# Patient Record
Sex: Female | Born: 1950 | Race: White | Hispanic: No | Marital: Married | Smoking: Former smoker
Health system: Southern US, Community
[De-identification: ages and names within clinical notes are randomized; demographics above are authoritative.]

## PROBLEM LIST (undated history)

## (undated) DIAGNOSIS — F329 Major depressive disorder, single episode, unspecified: Secondary | ICD-10-CM

## (undated) DIAGNOSIS — M1990 Unspecified osteoarthritis, unspecified site: Secondary | ICD-10-CM

## (undated) DIAGNOSIS — I10 Essential (primary) hypertension: Secondary | ICD-10-CM

## (undated) DIAGNOSIS — F32A Depression, unspecified: Secondary | ICD-10-CM

## (undated) HISTORY — PX: CARPAL TUNNEL RELEASE: SHX101

## (undated) HISTORY — PX: KNEE SURGERY: SHX244

## (undated) HISTORY — PX: GASTRIC BYPASS: SHX52

## (undated) HISTORY — PX: APPENDECTOMY: SHX54

---

## 2009-06-20 HISTORY — PX: OTHER SURGICAL HISTORY: SHX169

## 2009-06-22 ENCOUNTER — Ambulatory Visit: Payer: Self-pay | Admitting: Internal Medicine

## 2009-06-22 ENCOUNTER — Inpatient Hospital Stay (HOSPITAL_COMMUNITY): Admission: EM | Admit: 2009-06-22 | Discharge: 2009-06-26 | Payer: Self-pay | Admitting: Emergency Medicine

## 2009-10-02 ENCOUNTER — Telehealth: Payer: Self-pay | Admitting: Critical Care Medicine

## 2010-01-09 IMAGING — CT CT CHEST W/O CM
2 of 3 series · 15 of 36 positions shown, 18 images · non-contrast
Comparison: Chest x-ray 06/23/2009

CT CHEST

CLINICAL DATA: Shock.  Acute renal failure.  Anemia.  The patient
became hypotensive after tummy tuck procedure.

CT CHEST, ABDOMEN AND PELVIS WITHOUT CONTRAST
TECHNIQUE: Multidetector CT imaging of the chest, abdomen and
pelvis was performed following the standard protocol without IV
contrast.

[Series 2: chest_routine 5.0 b40f st · axial · 0.74mm/px · z∈[-611,-96]mm · 12 of 121 slices shown, 15 images]
[im 9/121  mediastinal]
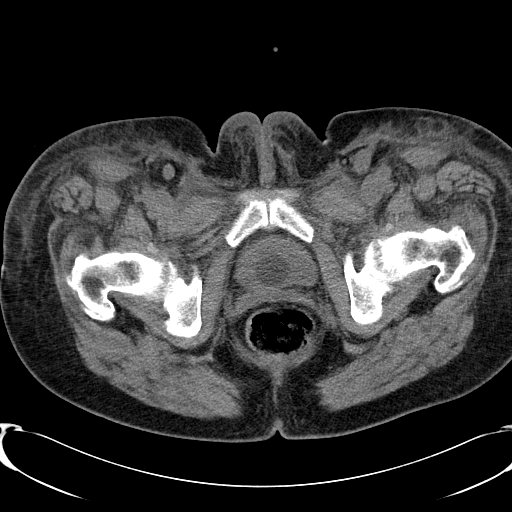
[im 9/121  lung]
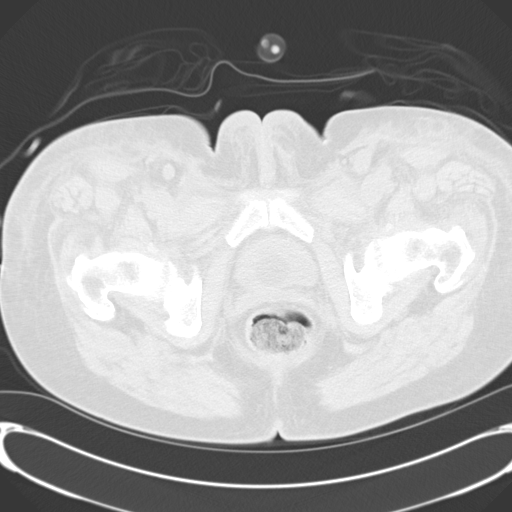
[im 18/121  lung]
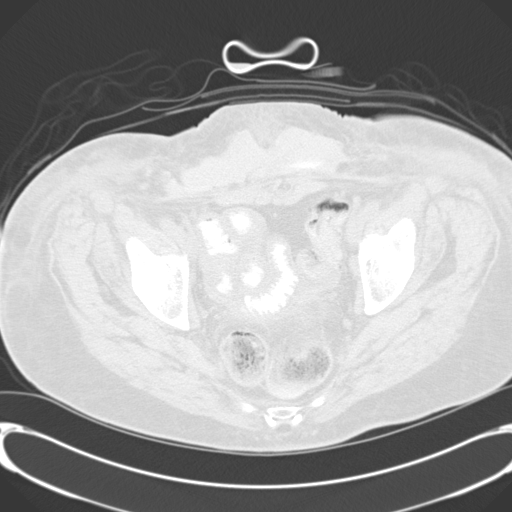
[im 27/121  lung]
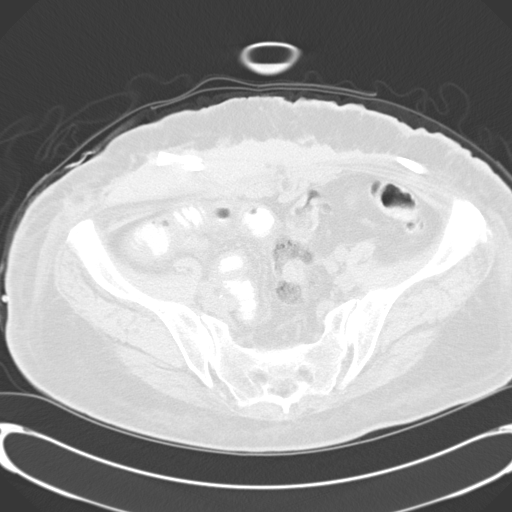
[im 36/121  lung]
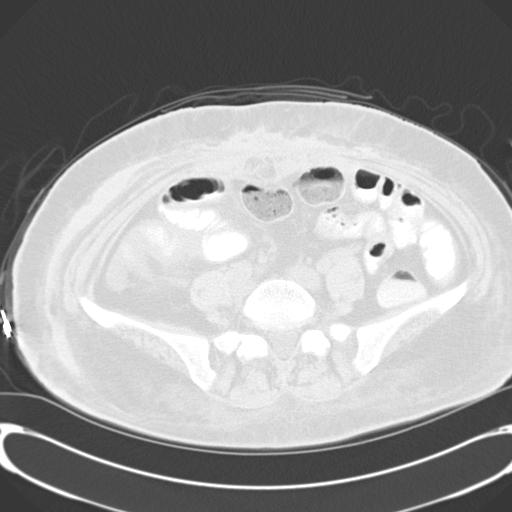
[im 45/121  mediastinal]
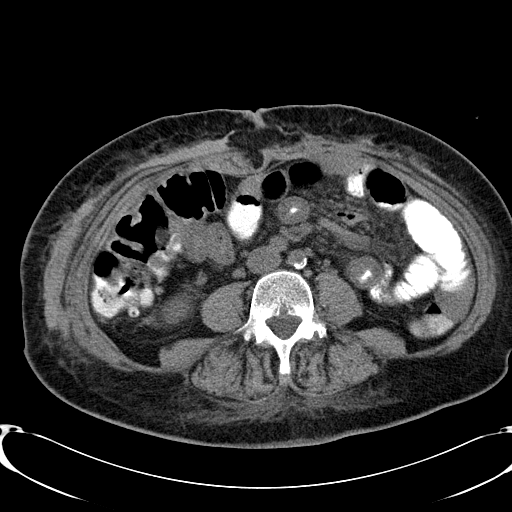
[im 45/121  lung]
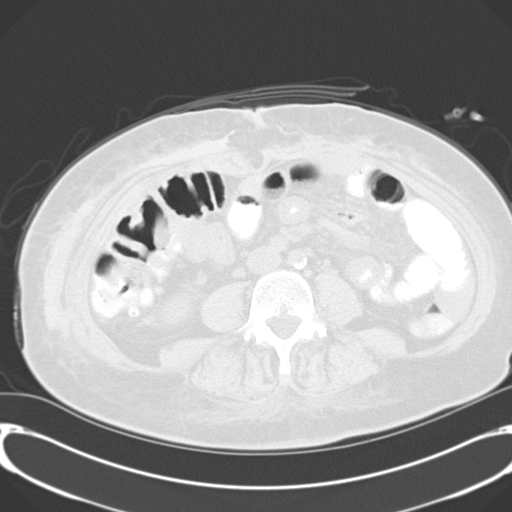
[im 54/121  lung]
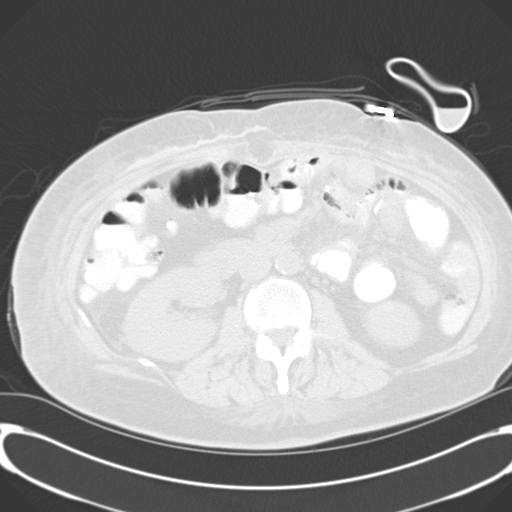
[im 67/121  lung]
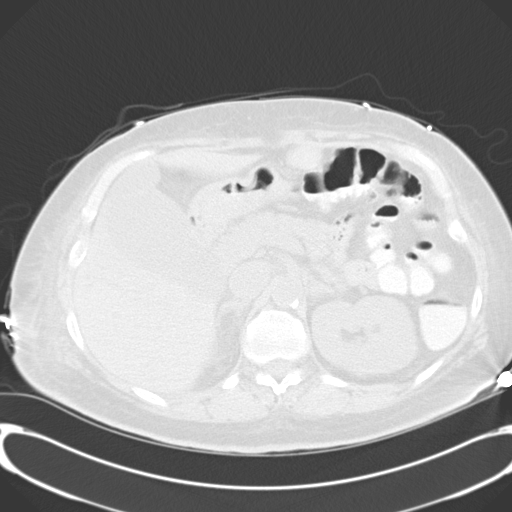
[im 76/121  lung]
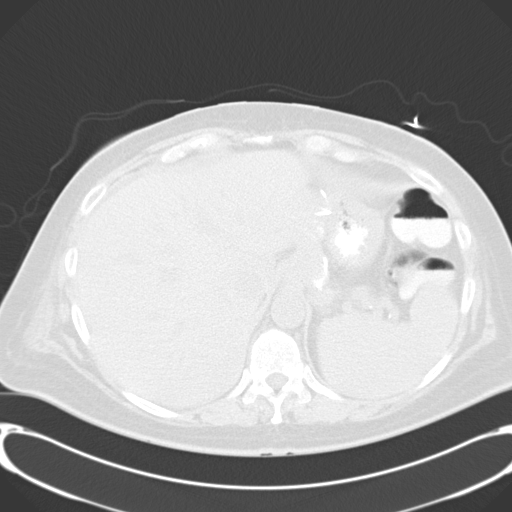
[im 85/121  mediastinal]
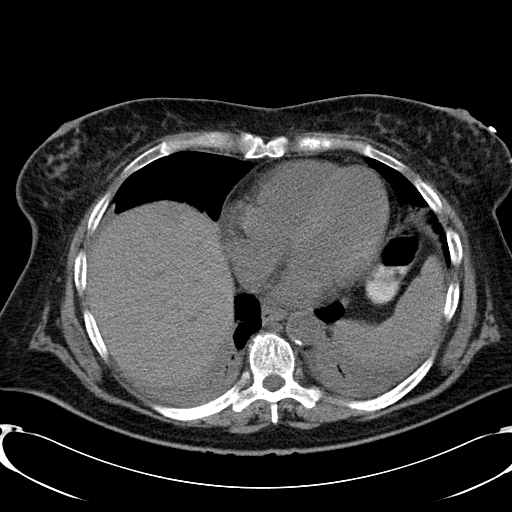
[im 85/121  lung]
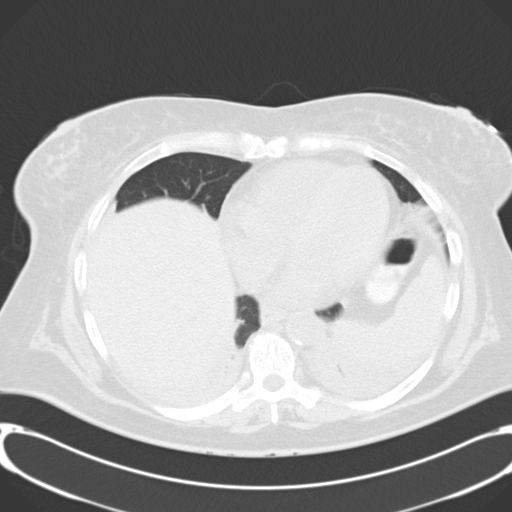
[im 94/121  lung]
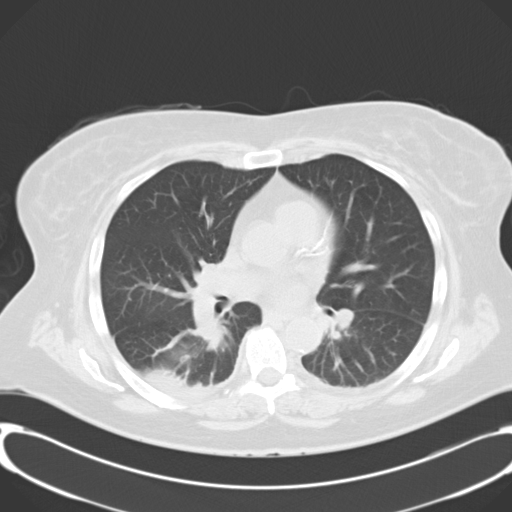
[im 103/121  lung]
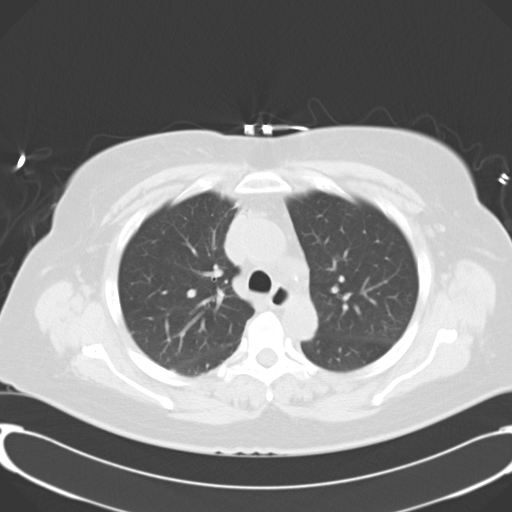
[im 112/121  lung]
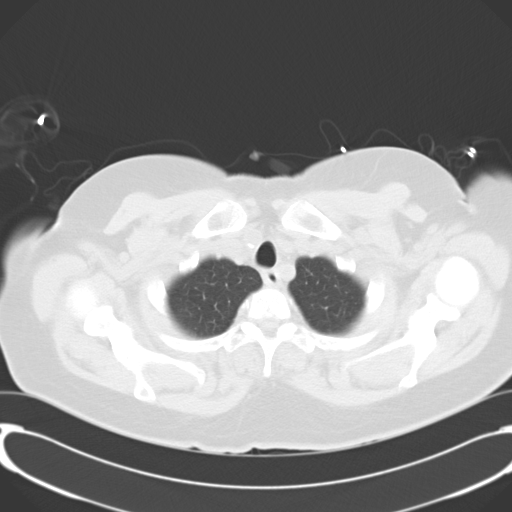

[Series 602: coronal cap · coronal · 1.21mm/px · 3 of 118 slices shown]
[im 24/118  lung]
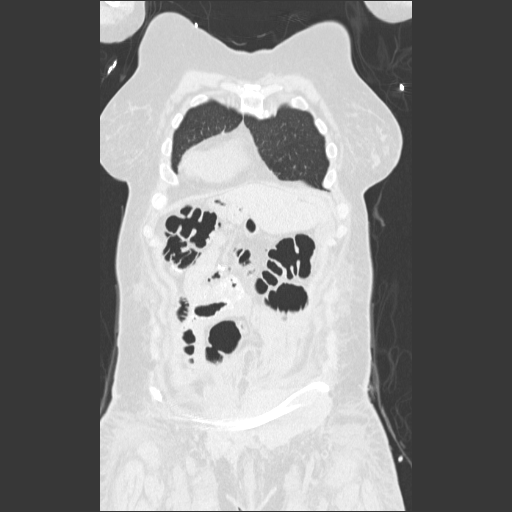
[im 47/118  lung]
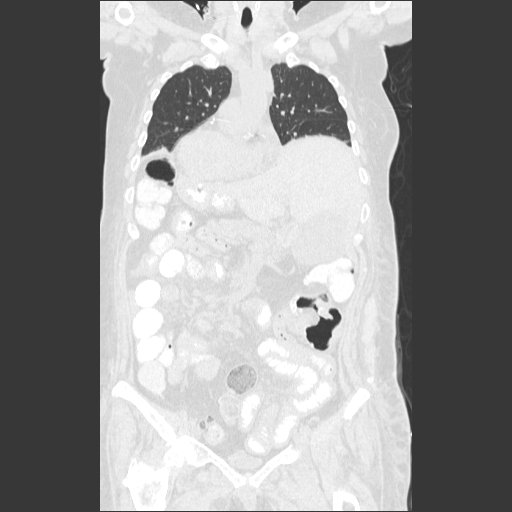
[im 71/118  lung]
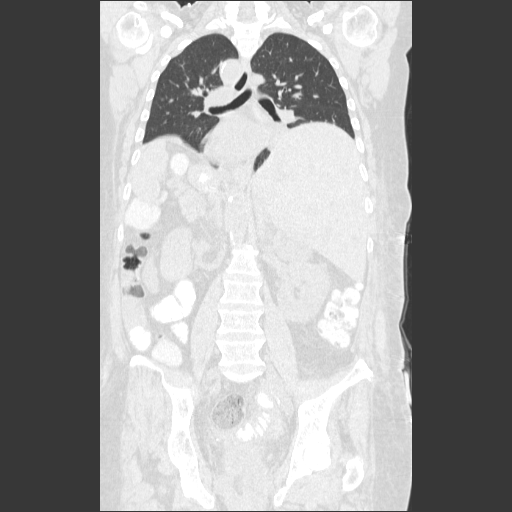

[15 of 36 positions shown; findings below may reference images not displayed]

FINDINGS: There are bilateral small pleural effusions.  Bibasilar
atelectasis / consolidation is identified.  Single anterior
mediastinal lymph node measures 1.4 cm.  No other mediastinal,
hilar, or axillary adenopathy identified.  Lung windows show no
nodules.  There is atherosclerotic calcification of the carotid
arteries.
IMPRESSION: 1.  Bilateral pleural effusions and bibasilar atelectasis or
consolidation.
2.  Small bilateral pleural effusions.

CT ABDOMEN
FINDINGS: Surgical clips are seen in the region of the stomach,
consistent with a history of prior gastric bypass surgery.  The
gallbladder is distended.  There is diffuse body wall and
mesenteric edema, consistent anasarca.  Postoperative changes are
identified within the anterior abdominal wall.  Two IVERSON drains are
identified superficial to the abdominal wall musculature.
Surrounding the IVERSON drains, there is an irregular collection of high-
density fluid, consistent with postoperative hematoma, measuring 16
(left to right) x 4.4 (anterior-posterior) x 7.4 (superior to
inferior) centimeters.  This collection is 1.5 cm in thickness and
extends around the right side of the abdomen superficial to the
transversalis muscle.  There is no evidence for intraperitoneal
collection of blood.

There is mild dilatation of small bowel loops.  Some bowel loops
have slightly thickened wall, possibly related to recent fluid
resuscitation.  No evidence for bowel obstruction.  Contrast has
reached the colon at the time of exam.  There are colonic
diverticula.
IMPRESSION: 1.  Postoperative fluid collection containing blood in the anterior
abdominal wall, surrounding two IVERSON drains.

2.  Gallbladder distention.
3.  Body wall and mesenteric edema.
4.  Diffuse small bowel dilatation, most suggestive of ileus.

CT PELVIS
FINDINGS: There is a Foley catheter in the bladder.  Patient has
had a hysterectomy.  No adnexal mass identified.  No pelvic
adenopathy.  Numerous colonic diverticula are present.  No evidence
for acute diverticulitis however.
IMPRESSION: 1.  Nonspecific small bowel wall thickening.
2.  Diverticulosis.

I discussed the findings with Dr. Pipkin.

## 2010-01-10 IMAGING — CR DG CHEST 1V PORT
1 series · 1 of 1 positions shown · non-contrast
Comparison: Chest CT and plain films of the chest 06/23/2009.

CLINICAL DATA: Shock, acute renal failure.

PORTABLE CHEST - 1 VIEW

[view not recorded]
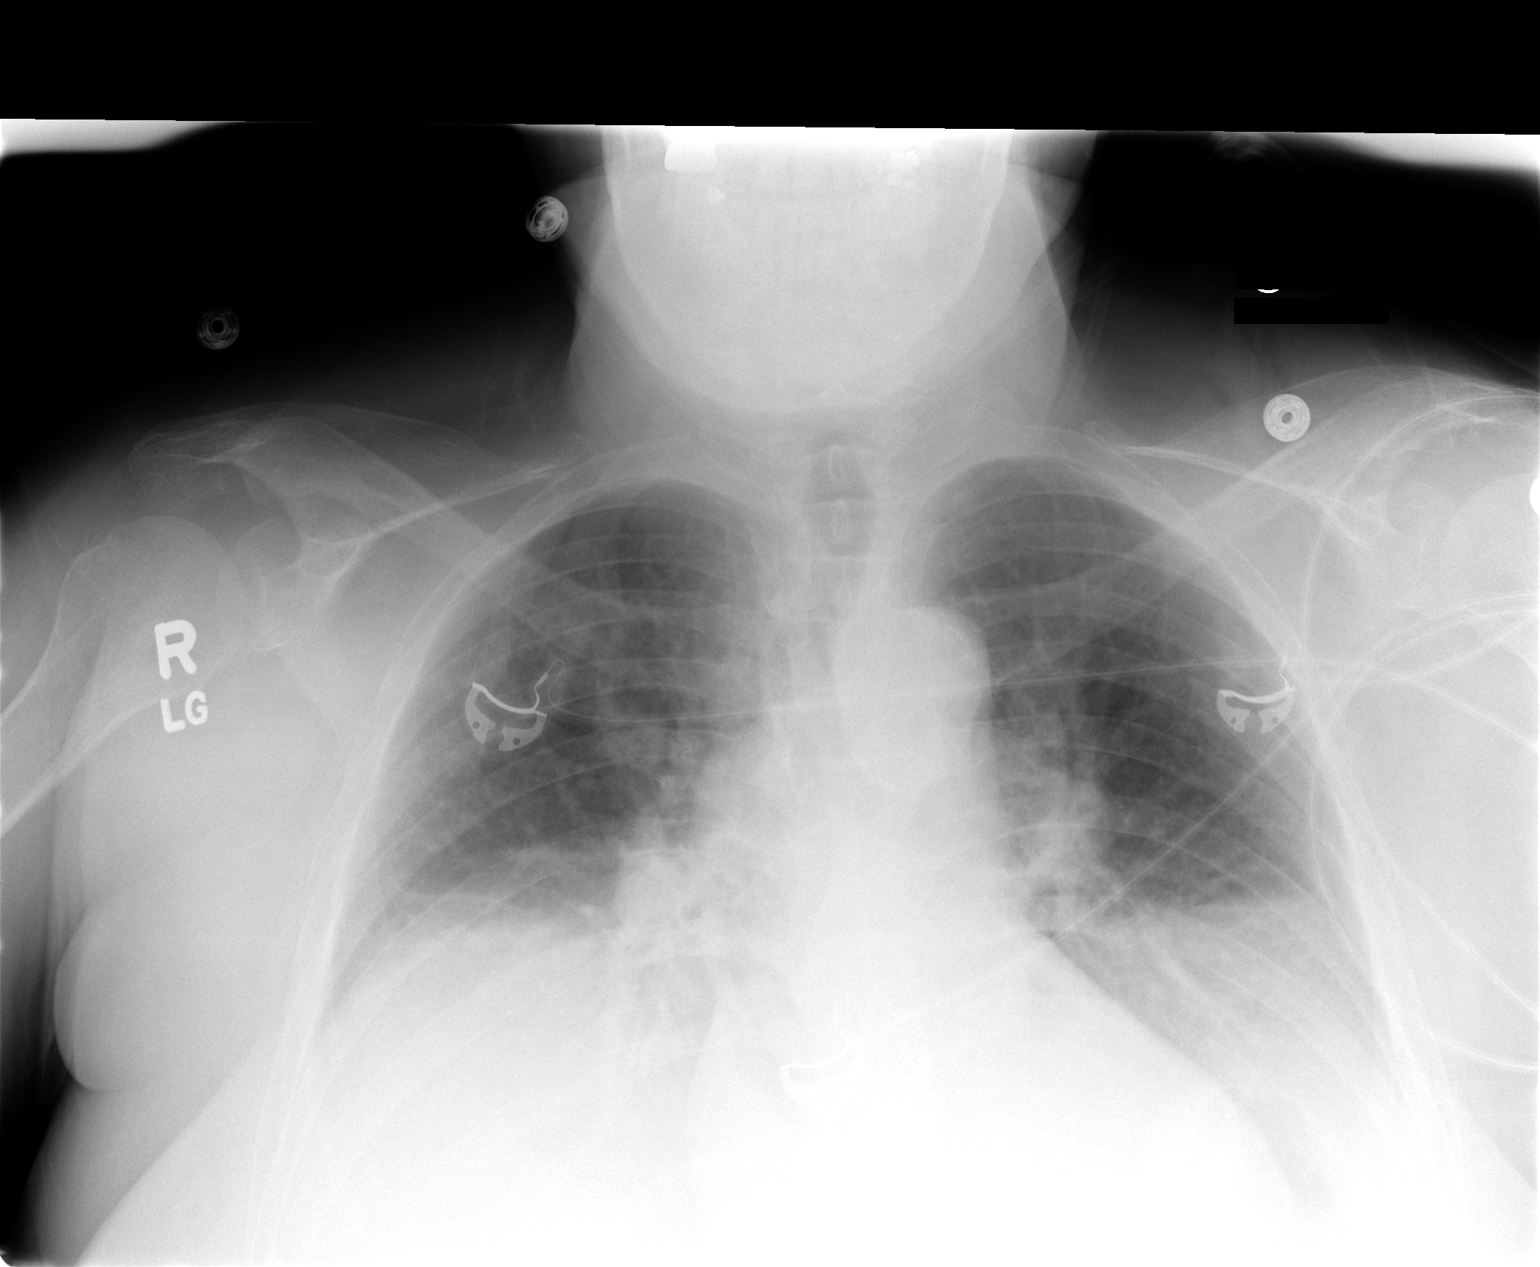

[1 of 1 positions shown; findings below may reference images not displayed]

FINDINGS: Small bilateral pleural effusions and basilar airspace
disease are again seen.  Right basilar atelectasis is increased.
Heart size is upper normal.  There is pulmonary vascular
congestion.
IMPRESSION: 1.  Mild increasing right basilar subsegmental atelectasis.
2.  Pulmonary vascular congestion.

## 2011-01-09 LAB — CBC
HCT: 23.1 % — ABNORMAL LOW (ref 36.0–46.0)
HCT: 23.5 % — ABNORMAL LOW (ref 36.0–46.0)
HCT: 24.5 % — ABNORMAL LOW (ref 36.0–46.0)
HCT: 24.9 % — ABNORMAL LOW (ref 36.0–46.0)
Hemoglobin: 7.6 g/dL — CL (ref 12.0–15.0)
Hemoglobin: 7.7 g/dL — CL (ref 12.0–15.0)
Hemoglobin: 7.8 g/dL — CL (ref 12.0–15.0)
Hemoglobin: 8.1 g/dL — ABNORMAL LOW (ref 12.0–15.0)
Hemoglobin: 8.5 g/dL — ABNORMAL LOW (ref 12.0–15.0)
MCHC: 33.3 g/dL (ref 30.0–36.0)
MCHC: 34 g/dL (ref 30.0–36.0)
MCHC: 34.2 g/dL (ref 30.0–36.0)
MCHC: 34.4 g/dL (ref 30.0–36.0)
MCHC: 34.5 g/dL (ref 30.0–36.0)
MCHC: 34.5 g/dL (ref 30.0–36.0)
MCV: 84.4 fL (ref 78.0–100.0)
MCV: 84.7 fL (ref 78.0–100.0)
MCV: 84.9 fL (ref 78.0–100.0)
MCV: 85.8 fL (ref 78.0–100.0)
Platelets: 182 10*3/uL (ref 150–400)
Platelets: 257 10*3/uL (ref 150–400)
Platelets: 293 10*3/uL (ref 150–400)
RBC: 2.53 MIL/uL — ABNORMAL LOW (ref 3.87–5.11)
RBC: 2.6 MIL/uL — ABNORMAL LOW (ref 3.87–5.11)
RBC: 2.9 MIL/uL — ABNORMAL LOW (ref 3.87–5.11)
RDW: 14.2 % (ref 11.5–15.5)
RDW: 15 % (ref 11.5–15.5)
RDW: 15.2 % (ref 11.5–15.5)
RDW: 15.2 % (ref 11.5–15.5)
RDW: 15.3 % (ref 11.5–15.5)
WBC: 13.1 10*3/uL — ABNORMAL HIGH (ref 4.0–10.5)
WBC: 21.4 10*3/uL — ABNORMAL HIGH (ref 4.0–10.5)
WBC: 28 10*3/uL — ABNORMAL HIGH (ref 4.0–10.5)

## 2011-01-09 LAB — POCT I-STAT, CHEM 8
BUN: 58 mg/dL — ABNORMAL HIGH (ref 6–23)
Calcium, Ion: 0.92 mmol/L — ABNORMAL LOW (ref 1.12–1.32)
Chloride: 103 mEq/L (ref 96–112)
Creatinine, Ser: 2.9 mg/dL — ABNORMAL HIGH (ref 0.4–1.2)
Glucose, Bld: 113 mg/dL — ABNORMAL HIGH (ref 70–99)

## 2011-01-09 LAB — BASIC METABOLIC PANEL
BUN: 55 mg/dL — ABNORMAL HIGH (ref 6–23)
CO2: 24 mEq/L (ref 19–32)
CO2: 32 mEq/L (ref 19–32)
Calcium: 7.5 mg/dL — ABNORMAL LOW (ref 8.4–10.5)
Calcium: 7.8 mg/dL — ABNORMAL LOW (ref 8.4–10.5)
Calcium: 7.8 mg/dL — ABNORMAL LOW (ref 8.4–10.5)
Chloride: 102 mEq/L (ref 96–112)
Chloride: 107 mEq/L (ref 96–112)
GFR calc Af Amer: 49 mL/min — ABNORMAL LOW (ref >60)
GFR calc non Af Amer: 12 mL/min — ABNORMAL LOW (ref >60)
Glucose, Bld: 96 mg/dL (ref 70–99)
Glucose, Bld: 97 mg/dL (ref 70–99)
Potassium: 4.8 mEq/L (ref 3.5–5.1)
Sodium: 139 mEq/L (ref 135–145)
Sodium: 142 mEq/L (ref 135–145)

## 2011-01-09 LAB — HEPATIC FUNCTION PANEL
ALT: 776 U/L — ABNORMAL HIGH (ref 0–35)
AST: 723 U/L — ABNORMAL HIGH (ref 0–37)
Albumin: 2.3 g/dL — ABNORMAL LOW (ref 3.5–5.2)
Alkaline Phosphatase: 64 U/L (ref 39–117)
Indirect Bilirubin: 0.4 mg/dL (ref 0.3–0.9)
Total Protein: 4 g/dL — ABNORMAL LOW (ref 6.0–8.3)

## 2011-01-09 LAB — COMPREHENSIVE METABOLIC PANEL
AST: 1277 U/L — ABNORMAL HIGH (ref 0–37)
AST: 638 U/L — ABNORMAL HIGH (ref 0–37)
Albumin: 2.1 g/dL — ABNORMAL LOW (ref 3.5–5.2)
Albumin: 2.3 g/dL — ABNORMAL LOW (ref 3.5–5.2)
Alkaline Phosphatase: 65 U/L (ref 39–117)
BUN: 11 mg/dL (ref 6–23)
Calcium: 7.3 mg/dL — ABNORMAL LOW (ref 8.4–10.5)
Calcium: 7.4 mg/dL — ABNORMAL LOW (ref 8.4–10.5)
Calcium: 7.6 mg/dL — ABNORMAL LOW (ref 8.4–10.5)
Chloride: 104 mEq/L (ref 96–112)
Creatinine, Ser: 0.76 mg/dL (ref 0.4–1.2)
Creatinine, Ser: 2.36 mg/dL — ABNORMAL HIGH (ref 0.4–1.2)
GFR calc Af Amer: 25 mL/min — ABNORMAL LOW (ref >60)
GFR calc Af Amer: >60 mL/min (ref >60)
GFR calc non Af Amer: 21 mL/min — ABNORMAL LOW (ref >60)
GFR calc non Af Amer: >60 mL/min (ref >60)
Glucose, Bld: 97 mg/dL (ref 70–99)
Total Protein: 4.4 g/dL — ABNORMAL LOW (ref 6.0–8.3)
Total Protein: 4.6 g/dL — ABNORMAL LOW (ref 6.0–8.3)

## 2011-01-09 LAB — DIC (DISSEMINATED INTRAVASCULAR COAGULATION)PANEL
D-Dimer, Quant: 0.5 ug/mL-FEU — ABNORMAL HIGH (ref 0.00–0.48)
INR: 1.9 — ABNORMAL HIGH (ref 0.00–1.49)
Smear Review: NONE SEEN

## 2011-01-09 LAB — CULTURE, BLOOD (ROUTINE X 2): Culture: NO GROWTH

## 2011-01-09 LAB — DIFFERENTIAL
Basophils Absolute: 0 10*3/uL (ref 0.0–0.1)
Basophils Relative: 0 % (ref 0–1)
Eosinophils Relative: 0 % (ref 0–5)
Lymphs Abs: 2.8 10*3/uL (ref 0.7–4.0)
Monocytes Relative: 9 % (ref 3–12)
Neutro Abs: 29.2 10*3/uL — ABNORMAL HIGH (ref 1.7–7.7)

## 2011-01-09 LAB — GLUCOSE, CAPILLARY: Glucose-Capillary: 104 mg/dL — ABNORMAL HIGH (ref 70–99)

## 2011-01-09 LAB — PREPARE FRESH FROZEN PLASMA

## 2011-01-09 LAB — LIPASE, BLOOD: Lipase: 15 U/L (ref 11–59)

## 2011-01-09 LAB — CROSSMATCH: ABO/RH(D): O NEG

## 2011-01-09 LAB — CARDIAC PANEL(CRET KIN+CKTOT+MB+TROPI)
Relative Index: INVALID (ref 0.0–2.5)
Total CK: 53 U/L (ref 7–177)

## 2011-01-09 LAB — POCT CARDIAC MARKERS
Myoglobin, poc: 260 ng/mL (ref 12–200)
Troponin i, poc: <0.05 ng/mL (ref 0.00–0.09)

## 2011-01-09 LAB — URINALYSIS, ROUTINE W REFLEX MICROSCOPIC
Bilirubin Urine: NEGATIVE
Glucose, UA: NEGATIVE mg/dL
Hgb urine dipstick: NEGATIVE
Protein, ur: NEGATIVE mg/dL

## 2011-01-09 LAB — LACTIC ACID, PLASMA: Lactic Acid, Venous: 12.3 mmol/L — ABNORMAL HIGH (ref 0.5–2.2)

## 2011-01-09 LAB — PROTIME-INR
INR: 1.1 (ref 0.00–1.49)
Prothrombin Time: 14.4 seconds (ref 11.6–15.2)

## 2011-01-09 LAB — APTT: aPTT: 28 seconds (ref 24–37)

## 2011-01-09 LAB — CK TOTAL AND CKMB (NOT AT ARMC): CK, MB: 6.6 ng/mL — ABNORMAL HIGH (ref 0.3–4.0)

## 2016-02-20 ENCOUNTER — Encounter (HOSPITAL_COMMUNITY)
Admission: EM | Disposition: A | Discharge status: Home / Self Care | Payer: Self-pay | Source: Home / Self Care | Attending: Internal Medicine

## 2016-02-20 ENCOUNTER — Inpatient Hospital Stay (HOSPITAL_COMMUNITY): Payer: Medicare Other | Admitting: Anesthesiology

## 2016-02-20 ENCOUNTER — Encounter (HOSPITAL_COMMUNITY): Payer: Self-pay

## 2016-02-20 ENCOUNTER — Emergency Department (HOSPITAL_COMMUNITY): Payer: Medicare Other

## 2016-02-20 ENCOUNTER — Inpatient Hospital Stay (HOSPITAL_COMMUNITY)
Admission: EM | Admit: 2016-02-20 | Discharge: 2016-02-22 | DRG: 378 | Disposition: A | Discharge status: Home / Self Care | Payer: Medicare Other | Attending: Internal Medicine | Admitting: Internal Medicine

## 2016-02-20 DIAGNOSIS — K922 Gastrointestinal hemorrhage, unspecified: Secondary | ICD-10-CM | POA: Diagnosis present

## 2016-02-20 DIAGNOSIS — I10 Essential (primary) hypertension: Secondary | ICD-10-CM | POA: Diagnosis present

## 2016-02-20 DIAGNOSIS — Z8249 Family history of ischemic heart disease and other diseases of the circulatory system: Secondary | ICD-10-CM | POA: Diagnosis not present

## 2016-02-20 DIAGNOSIS — M1990 Unspecified osteoarthritis, unspecified site: Secondary | ICD-10-CM | POA: Diagnosis present

## 2016-02-20 DIAGNOSIS — Z833 Family history of diabetes mellitus: Secondary | ICD-10-CM | POA: Diagnosis not present

## 2016-02-20 DIAGNOSIS — K921 Melena: Secondary | ICD-10-CM | POA: Diagnosis not present

## 2016-02-20 DIAGNOSIS — R000 Tachycardia, unspecified: Secondary | ICD-10-CM | POA: Diagnosis present

## 2016-02-20 DIAGNOSIS — Z9884 Bariatric surgery status: Secondary | ICD-10-CM

## 2016-02-20 DIAGNOSIS — F329 Major depressive disorder, single episode, unspecified: Secondary | ICD-10-CM | POA: Diagnosis present

## 2016-02-20 DIAGNOSIS — D62 Acute posthemorrhagic anemia: Secondary | ICD-10-CM

## 2016-02-20 DIAGNOSIS — F32A Depression, unspecified: Secondary | ICD-10-CM

## 2016-02-20 DIAGNOSIS — K254 Chronic or unspecified gastric ulcer with hemorrhage: Secondary | ICD-10-CM | POA: Diagnosis present

## 2016-02-20 DIAGNOSIS — E872 Acidosis, unspecified: Secondary | ICD-10-CM

## 2016-02-20 DIAGNOSIS — R195 Other fecal abnormalities: Secondary | ICD-10-CM | POA: Diagnosis present

## 2016-02-20 DIAGNOSIS — D638 Anemia in other chronic diseases classified elsewhere: Secondary | ICD-10-CM

## 2016-02-20 HISTORY — PX: ESOPHAGOGASTRODUODENOSCOPY: SHX5428

## 2016-02-20 HISTORY — DX: Major depressive disorder, single episode, unspecified: F32.9

## 2016-02-20 HISTORY — DX: Essential (primary) hypertension: I10

## 2016-02-20 HISTORY — DX: Depression, unspecified: F32.A

## 2016-02-20 HISTORY — DX: Unspecified osteoarthritis, unspecified site: M19.90

## 2016-02-20 LAB — LIPASE, BLOOD: Lipase: 18 U/L (ref 11–51)

## 2016-02-20 LAB — COMPREHENSIVE METABOLIC PANEL
ALBUMIN: 3.5 g/dL (ref 3.5–5.0)
ALK PHOS: 58 U/L (ref 38–126)
ALT: 13 U/L — ABNORMAL LOW (ref 14–54)
ANION GAP: 9 (ref 5–15)
AST: 15 U/L (ref 15–41)
BILIRUBIN TOTAL: 0.8 mg/dL (ref 0.3–1.2)
BUN: 40 mg/dL — AB (ref 6–20)
CALCIUM: 8.6 mg/dL — AB (ref 8.9–10.3)
CO2: 20 mmol/L — ABNORMAL LOW (ref 22–32)
Chloride: 109 mmol/L (ref 101–111)
Creatinine, Ser: 0.62 mg/dL (ref 0.44–1.00)
GFR calc Af Amer: >60 mL/min (ref >60)
GLUCOSE: 160 mg/dL — AB (ref 65–99)
Potassium: 3.8 mmol/L (ref 3.5–5.1)
Sodium: 138 mmol/L (ref 135–145)
TOTAL PROTEIN: 5.4 g/dL — AB (ref 6.5–8.1)

## 2016-02-20 LAB — HEMOGLOBIN AND HEMATOCRIT, BLOOD
HEMATOCRIT: 23 % — AB (ref 36.0–46.0)
HEMATOCRIT: 19 % — AB (ref 36.0–46.0)
HEMOGLOBIN: 6.7 g/dL — AB (ref 12.0–15.0)
Hemoglobin: 7.8 g/dL — ABNORMAL LOW (ref 12.0–15.0)

## 2016-02-20 LAB — I-STAT CHEM 8, ED
BUN: 36 mg/dL — ABNORMAL HIGH (ref 6–20)
CALCIUM ION: 1.14 mmol/L (ref 1.13–1.30)
CREATININE: 0.5 mg/dL (ref 0.44–1.00)
Chloride: 106 mmol/L (ref 101–111)
GLUCOSE: 135 mg/dL — AB (ref 65–99)
HCT: 21 % — ABNORMAL LOW (ref 36.0–46.0)
HEMOGLOBIN: 7.1 g/dL — AB (ref 12.0–15.0)
Potassium: 3.7 mmol/L (ref 3.5–5.1)
Sodium: 145 mmol/L (ref 135–145)
TCO2: 18 mmol/L (ref 0–100)

## 2016-02-20 LAB — CBC WITH DIFFERENTIAL/PLATELET
BASOS PCT: 0 %
Basophils Absolute: 0 10*3/uL (ref 0.0–0.1)
Eosinophils Absolute: 0 10*3/uL (ref 0.0–0.7)
Eosinophils Relative: 0 %
HEMATOCRIT: 25.5 % — AB (ref 36.0–46.0)
HEMOGLOBIN: 9 g/dL — AB (ref 12.0–15.0)
LYMPHS PCT: 13 %
Lymphs Abs: 1.9 10*3/uL (ref 0.7–4.0)
MCH: 28 pg (ref 26.0–34.0)
MCHC: 35.3 g/dL (ref 30.0–36.0)
MCV: 79.4 fL (ref 78.0–100.0)
MONO ABS: 0.4 10*3/uL (ref 0.1–1.0)
MONOS PCT: 3 %
NEUTROS ABS: 12.6 10*3/uL — AB (ref 1.7–7.7)
NEUTROS PCT: 84 %
Platelets: 251 10*3/uL (ref 150–400)
RBC: 3.21 MIL/uL — ABNORMAL LOW (ref 3.87–5.11)
RDW: 13.9 % (ref 11.5–15.5)
WBC: 15 10*3/uL — ABNORMAL HIGH (ref 4.0–10.5)

## 2016-02-20 LAB — MRSA PCR SCREENING: MRSA BY PCR: NEGATIVE

## 2016-02-20 LAB — I-STAT CG4 LACTIC ACID, ED: Lactic Acid, Venous: 2.63 mmol/L (ref 0.5–2.0)

## 2016-02-20 LAB — PROTIME-INR
INR: 1.23 (ref 0.00–1.49)
PROTHROMBIN TIME: 15.2 s (ref 11.6–15.2)

## 2016-02-20 LAB — PREPARE RBC (CROSSMATCH)

## 2016-02-20 LAB — POC OCCULT BLOOD, ED: Fecal Occult Bld: POSITIVE — AB

## 2016-02-20 SURGERY — EGD (ESOPHAGOGASTRODUODENOSCOPY)
Anesthesia: Monitor Anesthesia Care

## 2016-02-20 MED ORDER — ONDANSETRON HCL 4 MG/2ML IJ SOLN
INTRAMUSCULAR | Status: AC
  Filled 2016-02-20: qty 2

## 2016-02-20 MED ORDER — SODIUM CHLORIDE 0.9 % IV SOLN
80.0000 mg | Freq: Once | INTRAVENOUS | Status: AC
  Administered 2016-02-20: 80 mg via INTRAVENOUS
  Filled 2016-02-20: qty 80

## 2016-02-20 MED ORDER — LIDOCAINE HCL (CARDIAC) 20 MG/ML IV SOLN
INTRAVENOUS | Status: DC | PRN
  Administered 2016-02-20: 50 mg via INTRATRACHEAL

## 2016-02-20 MED ORDER — SODIUM CHLORIDE 0.9 % IV BOLUS (SEPSIS)
1000.0000 mL | Freq: Once | INTRAVENOUS | Status: AC
Start: 2016-02-20 — End: 2016-02-20
  Administered 2016-02-20: 1000 mL via INTRAVENOUS

## 2016-02-20 MED ORDER — ONDANSETRON HCL 4 MG PO TABS
4.0000 mg | ORAL_TABLET | Freq: Four times a day (QID) | ORAL | Status: DC
  Administered 2016-02-20 – 2016-02-22 (×5): 4 mg via ORAL
  Filled 2016-02-20 (×6): qty 1

## 2016-02-20 MED ORDER — FLUOXETINE HCL 20 MG PO CAPS
40.0000 mg | ORAL_CAPSULE | Freq: Every day | ORAL | Status: DC
  Administered 2016-02-20 – 2016-02-22 (×3): 40 mg via ORAL
  Filled 2016-02-20 (×4): qty 2

## 2016-02-20 MED ORDER — EPINEPHRINE HCL 0.1 MG/ML IJ SOSY
PREFILLED_SYRINGE | INTRAMUSCULAR | Status: AC
  Filled 2016-02-20: qty 10

## 2016-02-20 MED ORDER — FENTANYL CITRATE (PF) 100 MCG/2ML IJ SOLN
INTRAMUSCULAR | Status: AC
  Filled 2016-02-20: qty 2

## 2016-02-20 MED ORDER — PROPOFOL 500 MG/50ML IV EMUL
INTRAVENOUS | Status: DC | PRN
  Administered 2016-02-20: 100 ug/kg/min via INTRAVENOUS

## 2016-02-20 MED ORDER — SODIUM CHLORIDE 0.9 % IV SOLN
8.0000 mg/h | INTRAVENOUS | Status: DC
  Administered 2016-02-20 – 2016-02-21 (×3): 8 mg/h via INTRAVENOUS
  Filled 2016-02-20 (×5): qty 80

## 2016-02-20 MED ORDER — ACETAMINOPHEN 325 MG PO TABS: 650.0000 mg | ORAL_TABLET | Freq: Four times a day (QID) | ORAL | Status: DC | PRN

## 2016-02-20 MED ORDER — LIDOCAINE HCL (CARDIAC) 20 MG/ML IV SOLN
INTRAVENOUS | Status: AC
  Filled 2016-02-20: qty 5

## 2016-02-20 MED ORDER — ONDANSETRON HCL 4 MG/2ML IJ SOLN
4.0000 mg | Freq: Four times a day (QID) | INTRAMUSCULAR | Status: DC
  Administered 2016-02-20 – 2016-02-21 (×3): 4 mg via INTRAVENOUS
  Filled 2016-02-20 (×2): qty 2

## 2016-02-20 MED ORDER — FENTANYL CITRATE (PF) 100 MCG/2ML IJ SOLN
25.0000 ug | Freq: Once | INTRAMUSCULAR | Status: AC
  Administered 2016-02-20: 25 ug via INTRAVENOUS

## 2016-02-20 MED ORDER — PROPOFOL 10 MG/ML IV BOLUS
INTRAVENOUS | Status: DC | PRN
  Administered 2016-02-20: 30 mg via INTRAVENOUS

## 2016-02-20 MED ORDER — ACETAMINOPHEN 650 MG RE SUPP: 650.0000 mg | Freq: Four times a day (QID) | RECTAL | Status: DC | PRN

## 2016-02-20 MED ORDER — PROPOFOL 10 MG/ML IV BOLUS
INTRAVENOUS | Status: AC
  Filled 2016-02-20: qty 40

## 2016-02-20 MED ORDER — SODIUM CHLORIDE 0.9 % IV SOLN
INTRAVENOUS | Status: DC
  Administered 2016-02-20 (×2): via INTRAVENOUS

## 2016-02-20 MED ORDER — CEFTRIAXONE SODIUM 1 G IJ SOLR: 1.0000 g | Freq: Once | INTRAMUSCULAR | Status: DC

## 2016-02-20 MED ORDER — SODIUM CHLORIDE 0.9 % IJ SOLN
PREFILLED_SYRINGE | INTRAMUSCULAR | Status: DC | PRN
  Administered 2016-02-20: 6 mL

## 2016-02-20 MED ORDER — SODIUM CHLORIDE 0.9 % IV SOLN: Freq: Once | INTRAVENOUS | Status: DC

## 2016-02-20 NOTE — ED Provider Notes (Addendum)
CSN: [id number]     Arrival date & time 02/20/16  1000 History   First MD Initiated Contact with Patient 02/20/16 1020     Chief Complaint  Patient presents with  . Shortness of Breath  . Diarrhea  . Nausea     (Consider location/radiation/quality/duration/timing/severity/associated sxs/prior Treatment) HPI  5AM began having dark tarry stools, has had 4 episodes.  Shortness of breath started around 6AM, feeling lightheaded, nausea when standing up with worsening shortness of breath, fatigue, no energy to move.  No vomiting. When standing does have some pain in upper abdomen, mild.  No CP. No hx of bleeding. Gastric bypass 2010.  Last week, had diarrhea, daughter had the same, not black or bloody then.   Past Medical History  Diagnosis Date  . Hypertension   . Depression   . Arthritis    Past Surgical History  Procedure Laterality Date  . Gastric bypass    . Appendectomy    . Carpal tunnel release    . Knee surgery Right   . Tummy tuck  06/20/2009   History reviewed. No pertinent family history. Social History  Substance Use Topics  . Smoking status: Never Smoker   . Smokeless tobacco: Never Used  . Alcohol Use: Yes   OB History    No data available     Review of Systems  Constitutional: Negative for fever.  HENT: Negative for sore throat.   Eyes: Negative for visual disturbance.  Respiratory: Positive for shortness of breath. Negative for cough.   Cardiovascular: Negative for chest pain.  Gastrointestinal: Positive for nausea, abdominal pain (mild epigastric), diarrhea and blood in stool. Negative for vomiting.  Genitourinary: Negative for difficulty urinating.  Musculoskeletal: Negative for back pain and neck pain.  Skin: Negative for rash.  Neurological: Positive for light-headedness. Negative for syncope and headaches.      Allergies  Review of patient's allergies indicates no known allergies.  Home Medications   Prior to Admission medications    Medication Sig Start Date End Date Taking? Authorizing Provider  acetaminophen (TYLENOL) 500 MG tablet Take 1,000 mg by mouth every 6 (six) hours as needed for mild pain, moderate pain, fever or headache.   Yes Historical Provider, MD  calcium carbonate (OS-CAL) 600 MG tablet Take 600 mg by mouth daily.   Yes Historical Provider, MD  Cholecalciferol (VITAMIN D) 2000 units tablet Take 2,000 Units by mouth daily.   Yes Historical Provider, MD  FLUoxetine (PROZAC) 40 MG capsule Take 40 mg by mouth daily.   Yes Historical Provider, MD  lisinopril-hydrochlorothiazide (PRINZIDE,ZESTORETIC) 20-12.5 MG tablet Take 1 tablet by mouth daily. 02/14/16  Yes Historical Provider, MD  meloxicam (MOBIC) 15 MG tablet Take 15 mg by mouth daily.   Yes Historical Provider, MD   BP 116/61 mmHg  Pulse 84  Temp(Src) 98.5 F (36.9 C) (Oral)  Resp 11  Ht 5\' 4"  (1.626 m)  Wt 158 lb 11.7 oz (72 kg)  BMI 27.23 kg/m2  SpO2 99% Physical Exam  Constitutional: She is oriented to person, place, and time. She appears well-developed and well-nourished. No distress.  HENT:  Head: Normocephalic and atraumatic.  Eyes: Conjunctivae and EOM are normal.  Neck: Normal range of motion.  Cardiovascular: Normal rate, regular rhythm, normal heart sounds and intact distal pulses.  Exam reveals no gallop and no friction rub.   No murmur heard. Pulmonary/Chest: Effort normal and breath sounds normal. No respiratory distress. She has no wheezes. She has no rales.  Abdominal: Soft.  She exhibits no distension. There is tenderness (mild epigastric). There is no guarding.  Genitourinary: Rectal exam shows external hemorrhoid. Guaiac positive stool.  Melena, maroon tinge  Musculoskeletal: She exhibits no edema or tenderness.  Neurological: She is alert and oriented to person, place, and time.  Skin: Skin is warm and dry. No rash noted. She is not diaphoretic. No erythema. There is pallor.  Nursing note and vitals reviewed.   ED Course   Procedures (including critical care time) Labs Review Labs Reviewed  CBC WITH DIFFERENTIAL/PLATELET - Abnormal; Notable for the following:    WBC 15.0 (*)    RBC 3.21 (*)    Hemoglobin 9.0 (*)    HCT 25.5 (*)    Neutro Abs 12.6 (*)    All other components within normal limits  COMPREHENSIVE METABOLIC PANEL - Abnormal; Notable for the following:    CO2 20 (*)    Glucose, Bld 160 (*)    BUN 40 (*)    Calcium 8.6 (*)    Total Protein 5.4 (*)    ALT 13 (*)    All other components within normal limits  HEMOGLOBIN AND HEMATOCRIT, BLOOD - Abnormal; Notable for the following:    Hemoglobin 7.8 (*)    HCT 23.0 (*)    All other components within normal limits  HEMOGLOBIN AND HEMATOCRIT, BLOOD - Abnormal; Notable for the following:    Hemoglobin 6.7 (*)    HCT 19.0 (*)    All other components within normal limits  POC OCCULT BLOOD, ED - Abnormal; Notable for the following:    Fecal Occult Bld POSITIVE (*)    All other components within normal limits  I-STAT CHEM 8, ED - Abnormal; Notable for the following:    BUN 36 (*)    Glucose, Bld 135 (*)    Hemoglobin 7.1 (*)    HCT 21.0 (*)    All other components within normal limits  I-STAT CG4 LACTIC ACID, ED - Abnormal; Notable for the following:    Lactic Acid, Venous 2.63 (*)    All other components within normal limits  MRSA PCR SCREENING  PROTIME-INR  LIPASE, BLOOD  HEMOGLOBIN AND HEMATOCRIT, BLOOD  BASIC METABOLIC PANEL  HEMOGLOBIN AND HEMATOCRIT, BLOOD  TYPE AND SCREEN  PREPARE RBC (CROSSMATCH)    Imaging Review Dg Chest Portable 1 View  02/20/2016  CLINICAL DATA:  New onset of shortness of breath, dark stools today EXAM: PORTABLE CHEST 1 VIEW COMPARISON:  Chest x-ray of 06/24/2009 FINDINGS: No active infiltrate or effusion is seen. Mediastinal and hilar contours are unremarkable. The heart is within normal limits in size. No bony abnormality is seen. IMPRESSION: No active disease. Electronically Signed   By: Dwyane Dee M.D.    On: 02/20/2016 11:21   I have personally reviewed and evaluated these images and lab results as part of my medical decision-making.   EKG Interpretation   Date/Time:  Thursday Feb 20 2016 10:14:23 EDT Ventricular Rate:  126 PR Interval:  108 QRS Duration: 136 QT Interval:  332 QTC Calculation: 481 R Axis:   -124 Text Interpretation:  Sinus tachycardia Consider right atrial enlargement  Right bundle branch block No significant change since last tracing  Confirmed by Sanford Bismarck MD, Salena Ortlieb ([id number]) on 02/20/2016 10:50:33 AM      MDM   Final diagnoses:  Melena  Anemia due to blood loss, acute  Lactic acidosis   65yo female with history of gastric bypass presents with concern for dark tarry and bloody stools  beginning at [facility] today with associated lightheadedness and shortness of breath.  Most recent hgb from here is from 2010 when pt was hospitalized after a surgery and had significant illness and anemia, however reports she donates blood every 2 weeks (minimum hgb 12.5) and that 2 weeks ago she was found to have hgb of 14.  Suspect acute GI bleed given significant drop since symptoms started at 5AM and normal hemoglobin, as well as elevated BUN and patient symptomatic with maroon tinged melena on exam.  2 IV placed and pt placed on protonix gtt.  Have low threshold to transfuse, however given hgb 9 (suspect 7.1 istat was lab abnormality) and blood pressures stable without additional episodes in ED, will not transfuse at this time and continue to monitor.  Consulted GI who will perform endoscopy on patient.  Given 1L of NS, protonix. Doubt other etiology of lactic acidosis or dyspnea by history. Concern for possible anastamotic ulcer given hx of gastric bypass. No other hx of cirrhosis. Pt to be admitted to stepdown for continued care.    Alvira Monday, MD 02/20/16 0865  Alvira Monday, MD 02/20/16 [phone]

## 2016-02-20 NOTE — Anesthesia Postprocedure Evaluation (Signed)
Anesthesia Post Note  Patient: Lindsay Cruz  Procedure(s) Performed: Procedure(s) (LRB): ESOPHAGOGASTRODUODENOSCOPY (EGD) (N/A)  Patient location during evaluation: PACU Anesthesia Type: MAC Level of consciousness: awake and alert and oriented Pain management: pain level controlled Vital Signs Assessment: post-procedure vital signs reviewed and stable Respiratory status: spontaneous breathing, nonlabored ventilation and respiratory function stable Cardiovascular status: blood pressure returned to baseline and stable Anesthetic complications: no    Last Vitals:  Filed Vitals:   02/20/16 1500 02/20/16 1510  BP: 147/66 160/71  Pulse: 87 89  Temp:    Resp: 11 8    Last Pain:  Filed Vitals:   02/20/16 1513  PainSc: 3                  Tayson Schnelle A.

## 2016-02-20 NOTE — Progress Notes (Signed)
GI on call MD paged rt pt low hgb.

## 2016-02-20 NOTE — H&P (Signed)
History and Physical    Lindsay Cruz ZOX:[id number] DOB: 09/19/1951 DOA: 02/20/2016   PCP: Maximiano Coss, MD     Patient coming from: home  Chief Complaint: black stools   HPI: Lindsay Cruz is a 65 y.o. female with medical history significant of gastric bypass, arthritis, HTN, depression who is on Mobic presents with 4 episodes of black stools starting 5 AM. No vomiting. Felt nausea, lightheaded, and dyspneic when standing. She has occasional heart burn and uses Omeprazole PRN but has not been needing it recently. No epigastric pain. No weight loss. No h/o PUD.   ED Course: HR in 130s improved to 90s- only has received about 300 cc of her 1 L bolus. Asymptomatic right now.   Review of Systems:  All other systems reviewed and apart from HPI, are negative.  Past Medical History  Diagnosis Date  . Hypertension   . Depression   . Arthritis     Past Surgical History  Procedure Laterality Date  . Gastric bypass    . Appendectomy    . Carpal tunnel release    . Knee surgery Right   . Tummy tuck  06/20/2009    Social History: does not smoke, drinks beer occasionally, lives with husband part time and lives with her daughter and takes care of her twins part time  No Known Allergies  Family history: Mother- COPD CVA, DM- Father- DM, COPD, MI at age 4 which he died of   Prior to Admission medications   Medication Sig Start Date End Date Taking? Authorizing Provider  acetaminophen (TYLENOL) 500 MG tablet Take 1,000 mg by mouth every 6 (six) hours as needed for mild pain, moderate pain, fever or headache.   Yes Historical Provider, MD  calcium carbonate (OS-CAL) 600 MG tablet Take 600 mg by mouth daily.   Yes Historical Provider, MD  Cholecalciferol (VITAMIN D) 2000 units tablet Take 2,000 Units by mouth daily.   Yes Historical Provider, MD  FLUoxetine (PROZAC) 40 MG capsule Take 40 mg by mouth daily.   Yes Historical Provider, MD  lisinopril-hydrochlorothiazide  (PRINZIDE,ZESTORETIC) 20-12.5 MG tablet Take 1 tablet by mouth daily. 02/14/16  Yes Historical Provider, MD  meloxicam (MOBIC) 15 MG tablet Take 15 mg by mouth daily.   Yes Historical Provider, MD    Physical Exam: Filed Vitals:   02/20/16 1017  BP: 117/66  Pulse: 134  Temp: 98.4 F (36.9 C)  TempSrc: Oral  SpO2: 99%      Constitutional: NAD, calm, comfortable Eyes: PERTLA, lids and conjunctivae normal ENMT: Mucous membranes are moist. Posterior pharynx clear of any exudate or lesions. Normal dentition.  Neck: normal, supple, no masses, no thyromegaly Respiratory: clear to auscultation bilaterally, no wheezing, no crackles. Normal respiratory effort. No accessory muscle use.  Cardiovascular: S1 & S2 heard, regular rate and rhythm, no murmurs / rubs / gallops. No extremity edema. 2+ pedal pulses. No carotid bruits.  Abdomen: No distension,  Tenderness in LUQ and epigastrium, no masses palpated. No hepatosplenomegaly. Bowel sounds normal.  Musculoskeletal: no clubbing / cyanosis. No joint deformity upper and lower extremities. Good ROM, no contractures. Normal muscle tone.  Skin: no rashes, lesions, ulcers. No induration Neurologic: CN 2-12 grossly intact. Sensation intact, DTR normal. Strength 5/5 in all 4 limbs.  Psychiatric: Normal judgment and insight. Alert and oriented x 3. Normal mood.     Labs on Admission: I have personally reviewed following labs and imaging studies  CBC:  Recent Labs Lab 02/20/16 1053 02/20/16 1112  WBC 15.0*  --   NEUTROABS 12.6*  --   HGB 9.0* 7.1*  HCT 25.5* 21.0*  MCV 79.4  --   PLT 251  --    Basic Metabolic Panel:  Recent Labs Lab 02/20/16 1053 02/20/16 1112  NA 138 145  K 3.8 3.7  CL 109 106  CO2 20*  --   GLUCOSE 160* 135*  BUN 40* 36*  CREATININE 0.62 0.50  CALCIUM 8.6*  --    GFR: CrCl cannot be calculated (Unknown ideal weight.). Liver Function Tests:  Recent Labs Lab 02/20/16 1053  AST 15  ALT 13*  ALKPHOS 58    BILITOT 0.8  PROT 5.4*  ALBUMIN 3.5    Recent Labs Lab 02/20/16 1053  LIPASE 18   No results for input(s): AMMONIA in the last 168 hours. Coagulation Profile:  Recent Labs Lab 02/20/16 1053  INR 1.23   Cardiac Enzymes: No results for input(s): CKTOTAL, CKMB, CKMBINDEX, TROPONINI in the last 168 hours. BNP (last 3 results) No results for input(s): PROBNP in the last 8760 hours. HbA1C: No results for input(s): HGBA1C in the last 72 hours. CBG: No results for input(s): GLUCAP in the last 168 hours. Lipid Profile: No results for input(s): CHOL, HDL, LDLCALC, TRIG, CHOLHDL, LDLDIRECT in the last 72 hours. Thyroid Function Tests: No results for input(s): TSH, T4TOTAL, FREET4, T3FREE, THYROIDAB in the last 72 hours. Anemia Panel: No results for input(s): VITAMINB12, FOLATE, FERRITIN, TIBC, IRON, RETICCTPCT in the last 72 hours. Urine analysis:    Component Value Date/Time   COLORURINE YELLOW 06/22/2009 0540   APPEARANCEUR CLOUDY* 06/22/2009 0540   LABSPEC 1.017 06/22/2009 0540   PHURINE 5.5 06/22/2009 0540   GLUCOSEU NEGATIVE 06/22/2009 0540   HGBUR NEGATIVE 06/22/2009 0540   BILIRUBINUR NEGATIVE 06/22/2009 0540   KETONESUR TRACE* 06/22/2009 0540   PROTEINUR NEGATIVE 06/22/2009 0540   UROBILINOGEN 0.2 06/22/2009 0540   NITRITE NEGATIVE 06/22/2009 0540   LEUKOCYTESUR  06/22/2009 0540    NEGATIVE MICROSCOPIC NOT DONE ON URINES WITH NEGATIVE PROTEIN, BLOOD, LEUKOCYTES, NITRITE, OR GLUCOSE <1000 mg/dL.   Sepsis Labs: @LABRCNTIP (procalcitonin:4,lacticidven:4) )No results found for this or any previous visit (from the past 240 hour(s)).   Radiological Exams on Admission: Dg Chest Portable 1 View  02/20/2016  CLINICAL DATA:  New onset of shortness of breath, dark stools today EXAM: PORTABLE CHEST 1 VIEW COMPARISON:  Chest x-ray of 06/24/2009 FINDINGS: No active infiltrate or effusion is seen. Mediastinal and hilar contours are unremarkable. The heart is within normal limits  in size. No bony abnormality is seen. IMPRESSION: No active disease. Electronically Signed   By: Dwyane Dee M.D.   On: 02/20/2016 11:21    EKG: Independently reviewed. Sinus tachycardia, RBBB  Assessment/Plan Principal Problem:   Melena/ tachycardia and lactic acidosis due to acute blood loss - possibly has PUD as she takes Mobic - Protonix IV, NPO for now, Hb Q 8 hrs - GI called by ER - NS bolus 1 L followed by continuous infusion - admit to SDU  Active Problems:  Anemia - Hb unchanged so far from 2010- Hb of 7.1 was POC marker and likely is erroneous- recheck in a few hrs and follow Q 8 hrs  Leukocytosis - likely stress response on symptoms of infection    HTN (hypertension), benign -SBP in low 100s - hold medications    Arthritis - stop Mobic    Depression - Prozac    DVT prophylaxis: SCDs Code Status: Full code  Family Communication:  daughter/ sister  Disposition Plan: likely 2-3 day stay  Consults called: GI called by ER  Admission status: [place] ForeAdmit    Cordon Gassett MD [facility] Pager: [email or web address] Password [email or web address] 7PM-7AM, please contact night-coverage   02/20/2016, 12:37 PM

## 2016-02-20 NOTE — Anesthesia Preprocedure Evaluation (Addendum)
Anesthesia Evaluation  Patient identified by MRN, date of birth, ID band Patient awake    Reviewed: Allergy & Precautions, NPO status , Patient's Chart, lab work & pertinent test results  Airway Mallampati: II  TM Distance: >3 FB Neck ROM: Full    Dental no notable dental hx. (+) Teeth Intact   Pulmonary neg pulmonary ROS,    Pulmonary exam normal breath sounds clear to auscultation       Cardiovascular hypertension, Pt. on medications Normal cardiovascular exam Rhythm:Regular Rate:Normal  12 lead EKG- Sinus tachycardia with RBBB pattern   Neuro/Psych PSYCHIATRIC DISORDERS Depression negative neurological ROS     GI/Hepatic Neg liver ROS, GI Bleed Melena Hx/o Gastric Bypass   Endo/Other  negative endocrine ROS  Renal/GU negative Renal ROS  negative genitourinary   Musculoskeletal  (+) Arthritis , Osteoarthritis,    Abdominal (+) - obese,  Abdomen: soft. Bowel sounds: decreased.  Peds  Hematology  (+) anemia , Acute blood loss   Anesthesia Other Findings   Reproductive/Obstetrics                          Anesthesia Physical Anesthesia Plan  ASA: II  Anesthesia Plan: MAC   Post-op Pain Management:    Induction: Intravenous  Airway Management Planned: Natural Airway and Nasal Cannula  Additional Equipment:   Intra-op Plan:   Post-operative Plan:   Informed Consent: I have reviewed the patients History and Physical, chart, labs and discussed the procedure including the risks, benefits and alternatives for the proposed anesthesia with the patient or authorized representative who has indicated his/her understanding and acceptance.   Dental advisory given  Plan Discussed with: Anesthesiologist, CRNA and Surgeon  Anesthesia Plan Comments:         Anesthesia Quick Evaluation

## 2016-02-20 NOTE — Consult Note (Addendum)
[facility] Consult Note   History Lindsay Cruz MRN # [id number]  Date of Admission: 02/20/2016 Date of Consultation: 02/20/2016 Referring physician: Dr. Calvert Cantor, MD  Reason for Consultation/Chief Complaint: Melena and anemia  Subjective HPI:  This is a 65 year old woman not previously seen by our practice. She came to the ED this morning after having multiple episodes of passing black and maroon blood per rectum. She has not had hematemesis, and has no prior history of GI bleeding by her report. She underwent a gastric bypass in 2006, and reports that she has chronic diarrhea and abdominal bloating since then. She reports having a normal colonoscopy with Dr.Spainour within the last year. She denies dysphagia, odynophagia, vomiting, early satiety, or weight loss. She denies aspirin use, but uses meloxicam daily. She also reports some acute ill-defined nonradiating epigastric pain today. Her blood pressure was low and she was tachycardic upon admission to the ED, but has stabilized nicely after IV fluids. Currently her blood pressure is 120/70 with a pulse of 95, respiratory rate 16, she is afebrile. She is awake, alert, conversational and in good spirits. Both the daughter and sister are at the bedside.  ROS: Psychiatric:   Chronic stable depression  All other systems are negative except as noted above in the HPI  Past Medical History Past Medical History  Diagnosis Date  . Hypertension   . Depression   . Arthritis     Past Surgical History Past Surgical History  Procedure Laterality Date  . Gastric bypass    . Appendectomy    . Carpal tunnel release    . Knee surgery Right   . Tummy tuck  06/20/2009    Family History History reviewed. No pertinent family history. No known family history of GI malignancy. Social History Social History   Social History  . Marital Status: Married    Spouse Name: N/A  . Number of Children: N/A  . Years of Education:  N/A   Social History Main Topics  . Smoking status: Never Smoker   . Smokeless tobacco: Never Used  . Alcohol Use: Yes  . Drug Use: No  . Sexual Activity: No   Other Topics Concern  . None   Social History Narrative  . None    Allergies No Known Allergies  Outpatient Meds Home medications from the H+P and/or nursing med reconciliation reviewed. Her home medicines include meloxicam, Prozac and HCTZ  _____________________________________________________________________ Objective  Exam:  Current vital signs  Patient Vitals for the past 8 hrs:  BP Temp Temp src Pulse SpO2  02/20/16 1017 117/66 mmHg 98.4 F (36.9 C) Oral (!) 134 99 %   No intake or output data in the 24 hours ending 02/20/16 1245  Physical Exam:    General: this is a Well-appearing elderly female patient in no acute distress  Eyes: sclera anicteric, no redness  ENT: oral mucosa moist without lesions, no cervical or supraclavicular lymphadenopathy, good dentition  CV: RRR without murmur, S1/S2, no JVD,, no peripheral edema  Resp: clear to auscultation bilaterally, normal RR and effort noted  GI: soft, mild epigastric tenderness, with active bowel sounds. No guarding or palpable organomegaly noted  Skin; warm and dry, no rash or jaundice noted  Neuro: awake, alert and oriented x 3. Normal gross motor function and fluent speech. I spoke with the ED physician, who reported finding maroon blood on rectal exam Labs:   Recent Labs Lab 02/20/16 1053 02/20/16 1112  WBC 15.0*  --   HGB 9.0*  7.1*  HCT 25.5* 21.0*  PLT 251  --   ED physician reports that the Hgb of 7.1 was an Istat value , and that the 9.0 was the true lab value  Recent Labs Lab 02/20/16 1053 02/20/16 1112  NA 138 145  K 3.8 3.7  CL 109 106  CO2 20*  --   BUN 40* 36*  ALBUMIN 3.5  --   ALKPHOS 58  --   ALT 13*  --   AST 15  --   GLUCOSE 160* 135*    Recent Labs Lab 02/20/16 1053  INR 1.23      @ASSESSMENTPLANBEGIN @ Impression:  Melena Anemia of acute GI blood loss  I suspect the patient has an anastomotic ulcer exacerbated by meloxicam. Lower GI bleed seems less likely, especially with the elevated BUN.  Plan:  Patient has 2 peripheral IVs, and is on a Protonix drip (though that may not do much good in a  gastric bypass patient), and we will plan for an upper endoscopy this afternoon. She is agreeable after discussion of the procedure and risks.  The benefits and risks of the planned procedure were described in detail with the patient or (when appropriate) their health care proxy.  Risks were outlined as including, but not limited to, bleeding, infection, perforation, adverse medication reaction leading to cardiac or pulmonary decompensation, or pancreatitis (if ERCP).  The limitation of incomplete mucosal visualization was also discussed.  No guarantees or warranties were given.   Plan we'll follow her EGD.    Thank you for the courtesy of this consult.  Please contact me with any questions or concerns.  Charlie PitterHenry L Danis III Pager: [phone] Mon-Fri 8a-5p [phone] after 5p, weekends, holidays  CC: ED physician and Dr. Butler Denmarkizwan [facility]

## 2016-02-20 NOTE — Op Note (Signed)
[facility] Patient Name: Lindsay Cruz Procedure Date: 02/20/2016 MRN: [id number] Attending MD: Starr Lake. Myrtie Neither , MD Date of Birth: Sep 21, 1951 CSN: [id number] Age: 64 Admit Type: Inpatient Procedure:                Upper GI endoscopy Indications:              Acute post hemorrhagic anemia, Melena Providers:                Sherilyn Cooter L. Myrtie Neither, MD, Priscella Mann, RN, Roselie Awkward, RN, Darletta Moll, Technician Referring MD:              Medicines:                Monitored Anesthesia Care Complications:            No immediate complications. Estimated Blood Loss:     Estimated blood loss: none. Procedure:                Pre-Anesthesia Assessment:                           - Prior to the procedure, a History and Physical                            was performed, and patient medications and                            allergies were reviewed. The patient's tolerance of                            previous anesthesia was also reviewed. The risks                            and benefits of the procedure and the sedation                            options and risks were discussed with the patient.                            All questions were answered, and informed consent                            was obtained. Prior Anticoagulants: The patient has                            taken no previous anticoagulant or antiplatelet                            agents. ASA Grade Assessment: II - A patient with                            mild systemic disease. After reviewing the risks  and benefits, the patient was deemed in                            satisfactory condition to undergo the procedure.                           After obtaining informed consent, the endoscope was                            passed under direct vision. Throughout the                            procedure, the patient's blood pressure, pulse, and              oxygen saturations were monitored continuously. The                            Endosonoscope was introduced through the mouth, and                            advanced to the jejunum. The upper GI endoscopy was                            accomplished without difficulty. The patient                            tolerated the procedure well. Scope In: Scope Out: Findings:      The esophagus was normal.      Evidence of a gastric bypass was found. A gastric pouch with a large       size was found containing ulceration with a visible vessel at the       junction with the jejunum. The scope was advanced to 100 cm from the       teeth, and no additional lesions were found. There was no active       bleeding or blood seen in the visualized GI tract. The ulcer was very       difficult to completely visualize due to its location and motility. Only       glimpses of the vessel were obtained for photo-documentation. Its       location precluded a satisfactory approach for treatment with clip or       cautery. Therefore, the area was injected with 6 mL of a 1:10,000       solution of epinephrine for drug delivery. Good tissue blanching was       achieved.      The examined jejunum was normal. The jejunal-jejunal anastomosis was not       reached. Impression:               - Normal esophagus.                           - Gastric bypass with a large-sized pouch.Ulcer                            with visible vessel suspected to be the culprit for  bleeding. The proximal edge was injected with                            submucosal epinephrine.                           - Normal examined jejunum.                           - No specimens collected. Moderate Sedation:      MAC sedation used Recommendation:           - NPO.                           - Continue present medications.                           - Q 6 hour Hgb and Hct                           A repeat EGD may  be necessary if patient has                            recurrent bleeding from this site. Although this                            lesion carries about a 40% rebleeding risk, no                            contact thermal therapy was applied out of concern                            that it may precipitate bleeding that might then                            not be controlled due to the above-noted factors. 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 All rights reserved. The codes documented in this report are preliminary and upon coder review may  be revised to meet current compliance requirements. Lindsay Liller L. Myrtie Neitheranis, MD 02/20/2016 2:44:42 PM This report has been signed electronically. Number of Addenda: 0

## 2016-02-20 NOTE — Interval H&P Note (Signed)
History and Physical Interval Note:  02/20/2016 1:57 PM  [name]  has presented today for surgery, with the diagnosis of GI bleed  The various methods of treatment have been discussed with the patient and family. After consideration of risks, benefits and other options for treatment, the patient has consented to  Procedure(s): ESOPHAGOGASTRODUODENOSCOPY (EGD) (N/A) as a surgical intervention .  The patient's history has been reviewed, patient examined, no change in status, stable for surgery.  I have reviewed the patient's chart and labs.  Questions were answered to the patient's satisfaction.     Charlie PitterHenry L Danis III

## 2016-02-20 NOTE — ED Notes (Addendum)
Pt c/o SOB, diarrhea, rectal bleeding, and nausea starting this morning.   Denies pain.  Pt reports SOB increases w/ exertion.  Sts dark red blood in stool only w/ bowel movements.  Hx of gastric bypass in 2009/2010.

## 2016-02-20 NOTE — ED Notes (Signed)
[facility]

## 2016-02-20 NOTE — H&P (View-Only) (Signed)
[facility] Consult Note   History Lindsay Cruz MRN # [id number]  Date of Admission: 02/20/2016 Date of Consultation: 02/20/2016 Referring physician: Dr. Saima Rizwan, MD  Reason for Consultation/Chief Complaint: Melena and anemia  Subjective HPI:  This is a 65-year-old woman not previously seen by our practice. She came to the ED this morning after having multiple episodes of passing black and maroon blood per rectum. She has not had hematemesis, and has no prior history of GI bleeding by her report. She underwent a gastric bypass in 2006, and reports that she has chronic diarrhea and abdominal bloating since then. She reports having a normal colonoscopy with Dr.Spainour within the last year. She denies dysphagia, odynophagia, vomiting, early satiety, or weight loss. She denies aspirin use, but uses meloxicam daily. She also reports some acute ill-defined nonradiating epigastric pain today. Her blood pressure was low and she was tachycardic upon admission to the ED, but has stabilized nicely after IV fluids. Currently her blood pressure is 120/70 with a pulse of 95, respiratory rate 16, she is afebrile. She is awake, alert, conversational and in good spirits. Both the daughter and sister are at the bedside.  ROS: Psychiatric:   Chronic stable depression  All other systems are negative except as noted above in the HPI  Past Medical History Past Medical History  Diagnosis Date  . Hypertension   . Depression   . Arthritis     Past Surgical History Past Surgical History  Procedure Laterality Date  . Gastric bypass    . Appendectomy    . Carpal tunnel release    . Knee surgery Right   . Tummy tuck  06/20/2009    Family History History reviewed. No pertinent family history. No known family history of GI malignancy. Social History Social History   Social History  . Marital Status: Married    Spouse Name: N/A  . Number of Children: N/A  . Years of Education:  N/A   Social History Main Topics  . Smoking status: Never Smoker   . Smokeless tobacco: Never Used  . Alcohol Use: Yes  . Drug Use: No  . Sexual Activity: No   Other Topics Concern  . None   Social History Narrative  . None    Allergies No Known Allergies  Outpatient Meds Home medications from the H+P and/or nursing med reconciliation reviewed. Her home medicines include meloxicam, Prozac and HCTZ  _____________________________________________________________________ Objective  Exam:  Current vital signs  Patient Vitals for the past 8 hrs:  BP Temp Temp src Pulse SpO2  02/20/16 1017 117/66 mmHg 98.4 F (36.9 C) Oral (!) 134 99 %   No intake or output data in the 24 hours ending 02/20/16 1245  Physical Exam:    General: this is a Well-appearing elderly female patient in no acute distress  Eyes: sclera anicteric, no redness  ENT: oral mucosa moist without lesions, no cervical or supraclavicular lymphadenopathy, good dentition  CV: RRR without murmur, S1/S2, no JVD,, no peripheral edema  Resp: clear to auscultation bilaterally, normal RR and effort noted  GI: soft, mild epigastric tenderness, with active bowel sounds. No guarding or palpable organomegaly noted  Skin; warm and dry, no rash or jaundice noted  Neuro: awake, alert and oriented x 3. Normal gross motor function and fluent speech. I spoke with the ED physician, who reported finding maroon blood on rectal exam Labs:   Recent Labs Lab 02/20/16 1053 02/20/16 1112  WBC 15.0*  --   HGB 9.0*   7.1*  HCT 25.5* 21.0*  PLT 251  --   ED physician reports that the Hgb of 7.1 was an Istat value , and that the 9.0 was the true lab value  Recent Labs Lab 02/20/16 1053 02/20/16 1112  NA 138 145  K 3.8 3.7  CL 109 106  CO2 20*  --   BUN 40* 36*  ALBUMIN 3.5  --   ALKPHOS 58  --   ALT 13*  --   AST 15  --   GLUCOSE 160* 135*    Recent Labs Lab 02/20/16 1053  INR 1.23      @ASSESSMENTPLANBEGIN @ Impression:  Melena Anemia of acute GI blood loss  I suspect the patient has an anastomotic ulcer exacerbated by meloxicam. Lower GI bleed seems less likely, especially with the elevated BUN.  Plan:  Patient has 2 peripheral IVs, and is on a Protonix drip (though that may not do much good in a  gastric bypass patient), and we will plan for an upper endoscopy this afternoon. She is agreeable after discussion of the procedure and risks.  The benefits and risks of the planned procedure were described in detail with the patient or (when appropriate) their health care proxy.  Risks were outlined as including, but not limited to, bleeding, infection, perforation, adverse medication reaction leading to cardiac or pulmonary decompensation, or pancreatitis (if ERCP).  The limitation of incomplete mucosal visualization was also discussed.  No guarantees or warranties were given.   Plan we'll follow her EGD.    Thank you for the courtesy of this consult.  Please contact me with any questions or concerns.  Charlie PitterHenry L Danis III Pager: [phone] Mon-Fri 8a-5p [phone] after 5p, weekends, holidays  CC: ED physician and Dr. Butler Denmarkizwan [facility]

## 2016-02-20 NOTE — Progress Notes (Signed)
CRITICAL VALUE ALERT  Critical value received:  hbg 6.7  Date of notification:  02/20/2016  Time of notification:  2055 pm  Critical value read back: yes  Nurse who received alert:  Conley RollsGarland, Denette Hass Lavern, RN  MD notified (1st page):  [facility]  Time of first page:  2114 pm  MD notified (2nd page):  Time of second page:  Responding MD:  [facility] 1  Time MD responded:  2154

## 2016-02-20 NOTE — ED Notes (Signed)
Notified Dr Tomma LightningScholssman  - Lactic 2.63

## 2016-02-20 NOTE — Transfer of Care (Signed)
Immediate Anesthesia Transfer of Care Note  Patient: Lindsay Cruz  Procedure(s) Performed: Procedure(s): ESOPHAGOGASTRODUODENOSCOPY (EGD) (N/A)  Patient Location: PACU and Endoscopy Unit  Anesthesia Type:MAC  Level of Consciousness: awake and patient cooperative  Airway & Oxygen Therapy: Patient Spontanous Breathing and Patient connected to nasal cannula oxygen  Post-op Assessment: Report given to RN and Post -op Vital signs reviewed and stable  Post vital signs: Reviewed and stable  Last Vitals:  Filed Vitals:   02/20/16 1300 02/20/16 1330  BP: 118/63 126/68  Pulse: 93 93  Temp:    Resp: 14 10    Last Pain: There were no vitals filed for this visit.       Complications: No apparent anesthesia complications

## 2016-02-20 NOTE — Progress Notes (Signed)
Late entry: Pt nauseous in recovery from epi injection pain. GI MD notified and 4mg  of Zofran ordered. Zofran given per MD request. Pt states that nausea is better now.

## 2016-02-21 ENCOUNTER — Encounter (HOSPITAL_COMMUNITY): Payer: Self-pay | Admitting: Gastroenterology

## 2016-02-21 LAB — BASIC METABOLIC PANEL
ANION GAP: 4 — AB (ref 5–15)
BUN: 21 mg/dL — ABNORMAL HIGH (ref 6–20)
CALCIUM: 7.8 mg/dL — AB (ref 8.9–10.3)
CO2: 24 mmol/L (ref 22–32)
CREATININE: 0.64 mg/dL (ref 0.44–1.00)
Chloride: 113 mmol/L — ABNORMAL HIGH (ref 101–111)
GFR calc Af Amer: >60 mL/min (ref >60)
GLUCOSE: 99 mg/dL (ref 65–99)
Potassium: 3.6 mmol/L (ref 3.5–5.1)
Sodium: 141 mmol/L (ref 135–145)

## 2016-02-21 LAB — HEMOGLOBIN AND HEMATOCRIT, BLOOD
HCT: 21 % — ABNORMAL LOW (ref 36.0–46.0)
HCT: 24.8 % — ABNORMAL LOW (ref 36.0–46.0)
Hemoglobin: 7.3 g/dL — ABNORMAL LOW (ref 12.0–15.0)
Hemoglobin: 8.4 g/dL — ABNORMAL LOW (ref 12.0–15.0)

## 2016-02-21 LAB — PREPARE RBC (CROSSMATCH)

## 2016-02-21 MED ORDER — HYDROCHLOROTHIAZIDE 12.5 MG PO CAPS
12.5000 mg | ORAL_CAPSULE | Freq: Every day | ORAL | Status: DC
  Administered 2016-02-21: 12.5 mg via ORAL
  Filled 2016-02-21: qty 1

## 2016-02-21 MED ORDER — SODIUM CHLORIDE 0.9 % IV SOLN: Freq: Once | INTRAVENOUS | Status: DC

## 2016-02-21 MED ORDER — LISINOPRIL-HYDROCHLOROTHIAZIDE 20-12.5 MG PO TABS: 1.0000 | ORAL_TABLET | Freq: Every day | ORAL | Status: DC

## 2016-02-21 MED ORDER — PANTOPRAZOLE SODIUM 40 MG IV SOLR
40.0000 mg | Freq: Two times a day (BID) | INTRAVENOUS | Status: DC
  Administered 2016-02-21 – 2016-02-22 (×3): 40 mg via INTRAVENOUS
  Filled 2016-02-21 (×3): qty 40

## 2016-02-21 MED ORDER — LISINOPRIL 10 MG PO TABS
20.0000 mg | ORAL_TABLET | Freq: Every day | ORAL | Status: DC
  Administered 2016-02-21: 20 mg via ORAL
  Filled 2016-02-21: qty 2

## 2016-02-21 NOTE — Progress Notes (Signed)
PROGRESS NOTE    [place]  ZOX:[id number] DOB: 30-Oct-1950 DOA: 02/20/2016  PCP: Maximiano CossHUNGARLAND,Lindsay DAVID, MD   Brief Narrative:  [name] is a 65 y.o. female with medical history significant of gastric bypass, arthritis, HTN, depression who is on Mobic presents with 4 episodes of black stools starting 5 AM. No vomiting. Felt nausea, lightheaded, and dyspneic when standing. She has occasional heart burn and uses Omeprazole PRN but has not been needing it recently. No epigastric pain. No weight loss. No h/o PUD.   Subjective: One small bloody stool after colonoscopy yesterday and none since. No nausea or vomiting.   Assessment & Plan: Principal Problem:  Melena/ tachycardia and lactic acidosis due to acute blood loss -  Ulcer found in gastric pouch which was injected-  D/c Mobic - Protonix IV, liquid diet - no further bleeding as of yet  Active Problems:  Anemia - with acute blood loss- will give 2nd unit of blood    Leukocytosis - likely stress response on symptoms of infection   HTN (hypertension), benign -SBP in low 100s on admision but now elevated- resume Lisinopril/ HCTZ   Arthritis - stop Mobic   Depression - Prozac    DVT prophylaxis:  SCDs Code Status: Full code Family Communication:  Disposition Plan: home in 1-2 days Consultants:   GI Procedures:  EGD -  Normal esophagus. - Gastric bypass with a large-sized pouch.Ulcer with visible vessel suspected to be the culprit for bleeding. The proximal edge was injected with submucosal epinephrine.- Normal examined jejunum.   Antimicrobials:  Anti-infectives    Start     Dose/Rate Route Frequency Ordered Stop   02/20/16 1330  cefTRIAXone (ROCEPHIN) 1 g in dextrose 5 % 50 mL IVPB  Status:  Discontinued     1 g 100 mL/hr over 30 Minutes Intravenous  Once 02/20/16 1328 02/20/16 1331       Objective: Filed Vitals:   02/21/16 1039 02/21/16 1200 02/21/16 1259 02/21/16 1600  BP: 111/49  100/49    Pulse: 92  79   Temp: 98.3 F (36.8 C) 99 F (37.2 C) 98.4 F (36.9 C) 99.4 F (37.4 C)  TempSrc: Oral Oral Oral Oral  Resp: 18  13   Height:      Weight:      SpO2:   99%     Intake/Output Summary (Last 24 hours) at 02/21/16 1730 Last data filed at 02/21/16 1408  Gross per 24 hour  Intake   3765 ml  Output   1400 ml  Net   2365 ml   Filed Weights   02/20/16 1335 02/20/16 1544  Weight: 72.576 kg (160 lb) 72 kg (158 lb 11.7 oz)    Examination: General exam: Appears comfortable  HEENT: PERRLA, oral mucosa moist, no sclera icterus or thrush Respiratory system: Clear to auscultation. Respiratory effort normal. Cardiovascular system: S1 & S2 heard, RRR.  No murmurs  Gastrointestinal system: Abdomen soft, non-tender, nondistended. Normal bowel sound. No organomegaly Central nervous system: Alert and oriented. No focal neurological deficits. Extremities: No cyanosis, clubbing or edema Skin: No rashes or ulcers Psychiatry:  Mood & affect appropriate.     Data Reviewed: I have personally reviewed following labs and imaging studies  CBC:  Recent Labs Lab 02/20/16 1053 02/20/16 1112 02/20/16 1551 02/20/16 2022 02/21/16 0314 02/21/16 1526  WBC 15.0*  --   --   --   --   --   NEUTROABS 12.6*  --   --   --   --   --  HGB 9.0* 7.1* 7.8* 6.7* 7.3* 8.4*  HCT 25.5* 21.0* 23.0* 19.0* 21.0* 24.8*  MCV 79.4  --   --   --   --   --   PLT 251  --   --   --   --   --    Basic Metabolic Panel:  Recent Labs Lab 02/20/16 1053 02/20/16 1112 02/21/16 0314  NA 138 145 141  K 3.8 3.7 3.6  CL 109 106 113*  CO2 20*  --  24  GLUCOSE 160* 135* 99  BUN 40* 36* 21*  CREATININE 0.62 0.50 0.64  CALCIUM 8.6*  --  7.8*   GFR: Estimated Creatinine Clearance: 66.4 mL/min (by C-G formula based on Cr of 0.64). Liver Function Tests:  Recent Labs Lab 02/20/16 1053  AST 15  ALT 13*  ALKPHOS 58  BILITOT 0.8  PROT 5.4*  ALBUMIN 3.5    Recent Labs Lab 02/20/16 1053  LIPASE  18   No results for input(s): AMMONIA in the last 168 hours. Coagulation Profile:  Recent Labs Lab 02/20/16 1053  INR 1.23   Cardiac Enzymes: No results for input(s): CKTOTAL, CKMB, CKMBINDEX, TROPONINI in the last 168 hours. BNP (last 3 results) No results for input(s): PROBNP in the last 8760 hours. HbA1C: No results for input(s): HGBA1C in the last 72 hours. CBG: No results for input(s): GLUCAP in the last 168 hours. Lipid Profile: No results for input(s): CHOL, HDL, LDLCALC, TRIG, CHOLHDL, LDLDIRECT in the last 72 hours. Thyroid Function Tests: No results for input(s): TSH, T4TOTAL, FREET4, T3FREE, THYROIDAB in the last 72 hours. Anemia Panel: No results for input(s): VITAMINB12, FOLATE, FERRITIN, TIBC, IRON, RETICCTPCT in the last 72 hours. Urine analysis:    Component Value Date/Time   COLORURINE YELLOW 06/22/2009 0540   APPEARANCEUR CLOUDY* 06/22/2009 0540   LABSPEC 1.017 06/22/2009 0540   PHURINE 5.5 06/22/2009 0540   GLUCOSEU NEGATIVE 06/22/2009 0540   HGBUR NEGATIVE 06/22/2009 0540   BILIRUBINUR NEGATIVE 06/22/2009 0540   KETONESUR TRACE* 06/22/2009 0540   PROTEINUR NEGATIVE 06/22/2009 0540   UROBILINOGEN 0.2 06/22/2009 0540   NITRITE NEGATIVE 06/22/2009 0540   LEUKOCYTESUR  06/22/2009 0540    NEGATIVE MICROSCOPIC NOT DONE ON URINES WITH NEGATIVE PROTEIN, BLOOD, LEUKOCYTES, NITRITE, OR GLUCOSE <1000 mg/dL.   Sepsis Labs: (procalcitonin:4,lacticidven:4) ) Recent Results (from the past 240 hour(s))  MRSA PCR Screening     Status: None   Collection Time: 02/20/16  3:53 PM  Result Value Ref Range Status   MRSA by PCR NEGATIVE NEGATIVE Final    Comment:        The GeneXpert MRSA Assay (FDA approved for NASAL specimens only), is one component of a comprehensive MRSA colonization surveillance program. It is not intended to diagnose MRSA infection nor to guide or monitor treatment for MRSA infections.          Radiology Studies: Dg Chest  Portable 1 View  02/20/2016  CLINICAL DATA:  New onset of shortness of breath, dark stools today EXAM: PORTABLE CHEST 1 VIEW COMPARISON:  Chest x-ray of 06/24/2009 FINDINGS: No active infiltrate or effusion is seen. Mediastinal and hilar contours are unremarkable. The heart is within normal limits in size. No bony abnormality is seen. IMPRESSION: No active disease. Electronically Signed   By: Dwyane Dee M.D.   On: 02/20/2016 11:21      Scheduled Meds: . sodium chloride   Intravenous Once  . sodium chloride   Intravenous Once  . sodium chloride  Intravenous Once  . FLUoxetine  40 mg Oral Daily  . lisinopril  20 mg Oral Daily   And  . hydrochlorothiazide  12.5 mg Oral Daily  . ondansetron  4 mg Oral Q6H   Or  . ondansetron (ZOFRAN) IV  4 mg Intravenous Q6H  . pantoprazole (PROTONIX) IV  40 mg Intravenous Q12H   Continuous Infusions:     LOS: 1 day    Time spent in minutes: 35    Analia Zuk, MD [facility] Pager: [email or web address] Password [email or web address] 02/21/2016, 5:30 PM

## 2016-02-21 NOTE — Care Management Note (Signed)
Case Management Note  Patient Details  Name: Lindsay Cruz MRN: [id number] Date of Birth: 06/04/1951  Subjective/Objective:      Gi bleed              Action/Plan:Date:  Feb 21, 2016 Chart reviewed for concurrent status and case management needs. Will continue to follow patient for changes and needs: Expected discharge date: [id number] Marcelle SmilingRhonda Davis, BSN, [place]RN3, [place]   [phone]   Expected Discharge Date:   (UNKNOWN)               Expected Discharge Plan:  Home/Self Care  In-House Referral:  NA  Discharge planning Services  CM Consult  Post Acute Care Choice:  NA Choice offered to:  NA  DME Arranged:  N/A DME Agency:  NA  HH Arranged:  NA HH Agency:  NA  Status of Service:  In process, will continue to follow  Medicare Important Message Given:    Date Medicare IM Given:    Medicare IM give by:    Date Additional Medicare IM Given:    Additional Medicare Important Message give by:     If discussed at Long Length of Stay Meetings, dates discussed:    Additional Comments:  Golda AcreDavis, Rhonda Lynn, RN 02/21/2016, 10:28 AM

## 2016-02-21 NOTE — Progress Notes (Signed)
PROGRESS NOTE    [place]  ZOX:[id number] DOB: November 26, 1950 DOA: 02/20/2016  PCP: Maximiano Coss, MD   Brief Narrative:  Lindsay Cruz is a 65 y.o. female with medical history significant of gastric bypass, arthritis, HTN, depression who is on Mobic presents with 4 episodes of black stools starting 5 AM. No vomiting. Felt nausea, lightheaded, and dyspneic when standing. She has occasional heart burn and uses Omeprazole PRN but has not been needing it recently. No epigastric pain. No weight loss. No h/o PUD.   Subjective: One small bloody stool after colonoscopy yesterday and none since. No nausea or vomiting.   Assessment & Plan: Principal Problem:  Melena/ tachycardia and lactic acidosis due to acute blood loss -  Ulcer found in gastric pouch which was injected-  D/c Mobic - Protonix IV, liquid diet - no further bleeding as of yet  Active Problems:  Anemia - with acute blood loss- will give 2nd unit of blood    Leukocytosis - likely stress response on symptoms of infection   HTN (hypertension), benign -SBP in low 100s on admision but now elevated- resume Lisinopril/ HCTZ   Arthritis - stop Mobic   Depression - Prozac    DVT prophylaxis:  SCDs Code Status: Full code Family Communication:  Disposition Plan: home in 1-2 days Consultants:   GI Procedures:  EGD -  Normal esophagus. - Gastric bypass with a large-sized pouch.Ulcer with visible vessel suspected to be the culprit for bleeding. The proximal edge was injected with submucosal epinephrine.- Normal examined jejunum.   Antimicrobials:  Anti-infectives    Start     Dose/Rate Route Frequency Ordered Stop   02/20/16 1330  cefTRIAXone (ROCEPHIN) 1 g in dextrose 5 % 50 mL IVPB  Status:  Discontinued     1 g 100 mL/hr over 30 Minutes Intravenous  Once 02/20/16 1328 02/20/16 1331       Objective: Filed Vitals:   02/21/16 0400 02/21/16 0500 02/21/16 0600 02/21/16 0803  BP: 151/63  141/73    Pulse: 79  79   Temp:  98.3 F (36.8 C)  98.3 F (36.8 C)  TempSrc:  Oral  Oral  Resp: 16  10   Height:      Weight:      SpO2: 96%  97%     Intake/Output Summary (Last 24 hours) at 02/21/16 0808 Last data filed at 02/21/16 0600  Gross per 24 hour  Intake   3050 ml  Output      0 ml  Net   3050 ml   Filed Weights   02/20/16 1335 02/20/16 1544  Weight: 72.576 kg (160 lb) 72 kg (158 lb 11.7 oz)    Examination: General exam: Appears comfortable  HEENT: PERRLA, oral mucosa moist, no sclera icterus or thrush Respiratory system: Clear to auscultation. Respiratory effort normal. Cardiovascular system: S1 & S2 heard, RRR.  No murmurs  Gastrointestinal system: Abdomen soft, non-tender, nondistended. Normal bowel sound. No organomegaly Central nervous system: Alert and oriented. No focal neurological deficits. Extremities: No cyanosis, clubbing or edema Skin: No rashes or ulcers Psychiatry:  Mood & affect appropriate.     Data Reviewed: I have personally reviewed following labs and imaging studies  CBC:  Recent Labs Lab 02/20/16 1053 02/20/16 1112 02/20/16 1551 02/20/16 2022 02/21/16 0314  WBC 15.0*  --   --   --   --   NEUTROABS 12.6*  --   --   --   --   HGB 9.0* 7.1* 7.8*  6.7* 7.3*  HCT 25.5* 21.0* 23.0* 19.0* 21.0*  MCV 79.4  --   --   --   --   PLT 251  --   --   --   --    Basic Metabolic Panel:  Recent Labs Lab 02/20/16 1053 02/20/16 1112 02/21/16 0314  NA 138 145 141  K 3.8 3.7 3.6  CL 109 106 113*  CO2 20*  --  24  GLUCOSE 160* 135* 99  BUN 40* 36* 21*  CREATININE 0.62 0.50 0.64  CALCIUM 8.6*  --  7.8*   GFR: Estimated Creatinine Clearance: 66.4 mL/min (by C-G formula based on Cr of 0.64). Liver Function Tests:  Recent Labs Lab 02/20/16 1053  AST 15  ALT 13*  ALKPHOS 58  BILITOT 0.8  PROT 5.4*  ALBUMIN 3.5    Recent Labs Lab 02/20/16 1053  LIPASE 18   No results for input(s): AMMONIA in the last 168 hours. Coagulation  Profile:  Recent Labs Lab 02/20/16 1053  INR 1.23   Cardiac Enzymes: No results for input(s): CKTOTAL, CKMB, CKMBINDEX, TROPONINI in the last 168 hours. BNP (last 3 results) No results for input(s): PROBNP in the last 8760 hours. HbA1C: No results for input(s): HGBA1C in the last 72 hours. CBG: No results for input(s): GLUCAP in the last 168 hours. Lipid Profile: No results for input(s): CHOL, HDL, LDLCALC, TRIG, CHOLHDL, LDLDIRECT in the last 72 hours. Thyroid Function Tests: No results for input(s): TSH, T4TOTAL, FREET4, T3FREE, THYROIDAB in the last 72 hours. Anemia Panel: No results for input(s): VITAMINB12, FOLATE, FERRITIN, TIBC, IRON, RETICCTPCT in the last 72 hours. Urine analysis:    Component Value Date/Time   COLORURINE YELLOW 06/22/2009 0540   APPEARANCEUR CLOUDY* 06/22/2009 0540   LABSPEC 1.017 06/22/2009 0540   PHURINE 5.5 06/22/2009 0540   GLUCOSEU NEGATIVE 06/22/2009 0540   HGBUR NEGATIVE 06/22/2009 0540   BILIRUBINUR NEGATIVE 06/22/2009 0540   KETONESUR TRACE* 06/22/2009 0540   PROTEINUR NEGATIVE 06/22/2009 0540   UROBILINOGEN 0.2 06/22/2009 0540   NITRITE NEGATIVE 06/22/2009 0540   LEUKOCYTESUR  06/22/2009 0540    NEGATIVE MICROSCOPIC NOT DONE ON URINES WITH NEGATIVE PROTEIN, BLOOD, LEUKOCYTES, NITRITE, OR GLUCOSE <1000 mg/dL.   Sepsis Labs: @LABRCNTIP (procalcitonin:4,lacticidven:4) ) Recent Results (from the past 240 hour(s))  MRSA PCR Screening     Status: None   Collection Time: 02/20/16  3:53 PM  Result Value Ref Range Status   MRSA by PCR NEGATIVE NEGATIVE Final    Comment:        The GeneXpert MRSA Assay (FDA approved for NASAL specimens only), is one component of a comprehensive MRSA colonization surveillance program. It is not intended to diagnose MRSA infection nor to guide or monitor treatment for MRSA infections.          Radiology Studies: Dg Chest Portable 1 View  02/20/2016  CLINICAL DATA:  New onset of shortness of  breath, dark stools today EXAM: PORTABLE CHEST 1 VIEW COMPARISON:  Chest x-ray of 06/24/2009 FINDINGS: No active infiltrate or effusion is seen. Mediastinal and hilar contours are unremarkable. The heart is within normal limits in size. No bony abnormality is seen. IMPRESSION: No active disease. Electronically Signed   By: Dwyane DeePaul  Barry M.D.   On: 02/20/2016 11:21      Scheduled Meds: . sodium chloride   Intravenous Once  . sodium chloride   Intravenous Once  . FLUoxetine  40 mg Oral Daily  . ondansetron  4 mg Oral Q6H  Or  . ondansetron (ZOFRAN) IV  4 mg Intravenous Q6H   Continuous Infusions: . sodium chloride 125 mL/hr at 02/20/16 2000  . pantoprozole (PROTONIX) infusion 8 mg/hr (02/21/16 0600)     LOS: 1 day    Time spent in minutes: 35    Harsha Yusko, MD [facility] Pager: [email or web address] Password [email or web address] 02/21/2016, 8:08 AM

## 2016-02-21 NOTE — Progress Notes (Signed)
[facility] Progress Note  Chief Complaint: melena and anemia  Subjective History:  No more melena since before EGD yesterday.  Has LLQ pain No hematemesis or nausea.  Very thirsty. Rec'd 1 unit PRBCs ovn for Hgb 6.7  ROS: Cardiovascular:  no chest pain Respiratory: no dyspnea  Objective:  Med list reviewed  Vital signs in last 24 hrs: Filed Vitals:   02/21/16 0600 02/21/16 0803  BP: 141/73   Pulse: 79   Temp:  98.3 F (36.8 C)  Resp: 10     Physical Exam   HEENT: sclera anicteric, oral mucosa moist without lesions  Neck: supple, no thyromegaly, JVD or lymphadenopathy  Cardiac: RRR without murmurs, S1S2 heard, no peripheral edema  Pulm: clear to auscultation bilaterally, normal RR and effort noted  Abdomen: soft, mild LLQ tenderness, with active bowel sounds. No guarding or palpable hepatosplenomegaly  Skin; warm and dry, no jaundice or rash  Recent Labs:   Recent Labs Lab 02/20/16 1053  02/20/16 1551 02/20/16 2022 02/21/16 0314  WBC 15.0*  --   --   --   --   HGB 9.0*  < > 7.8* 6.7* 7.3*  HCT 25.5*  < > 23.0* 19.0* 21.0*  PLT 251  --   --   --   --   < > = values in this interval not displayed.  Recent Labs Lab 02/20/16 1053  02/21/16 0314  NA 138  < > 141  K 3.8  < > 3.6  CL 109  < > 113*  CO2 20*  --  24  BUN 40*  < > 21*  ALBUMIN 3.5  --   --   ALKPHOS 58  --   --   ALT 13*  --   --   AST 15  --   --   GLUCOSE 160*  < > 99  < > = values in this interval not displayed.  Recent Labs Lab 02/20/16 1053  INR 1.23    Radiologic studies:   @ASSESSMENTPLANBEGIN @ Assessment:  Melena Anemia of acute GI blood loss Anastomotic gastric ulcer with bleeding - has stopped.  Hgb re-equilibrating and BUN decreased.   Plan:  Tfx 1 unit PRBC's - order written Full liquids today Q 12 hr H/H - but obtain stat level if passes melena again. Remain in this unit until tomorrow Change PPI drip to twice daily dosing and stop IVF  Charlie PitterHenry L  Danis III Pager [phone] Mon-Fri 8a-5p [phone] after 5p, weekends, holidays

## 2016-02-22 LAB — BASIC METABOLIC PANEL
ANION GAP: 5 (ref 5–15)
BUN: 8 mg/dL (ref 6–20)
CHLORIDE: 104 mmol/L (ref 101–111)
CO2: 29 mmol/L (ref 22–32)
Calcium: 8.5 mg/dL — ABNORMAL LOW (ref 8.9–10.3)
Creatinine, Ser: 0.71 mg/dL (ref 0.44–1.00)
GFR calc Af Amer: >60 mL/min (ref >60)
Glucose, Bld: 98 mg/dL (ref 65–99)
POTASSIUM: 3.6 mmol/L (ref 3.5–5.1)
SODIUM: 138 mmol/L (ref 135–145)

## 2016-02-22 LAB — CBC
HEMATOCRIT: 26.4 % — AB (ref 36.0–46.0)
HEMOGLOBIN: 8.8 g/dL — AB (ref 12.0–15.0)
MCH: 27.9 pg (ref 26.0–34.0)
MCHC: 33.3 g/dL (ref 30.0–36.0)
MCV: 83.8 fL (ref 78.0–100.0)
Platelets: 204 10*3/uL (ref 150–400)
RBC: 3.15 MIL/uL — AB (ref 3.87–5.11)
RDW: 15 % (ref 11.5–15.5)
WBC: 6.7 10*3/uL (ref 4.0–10.5)

## 2016-02-22 MED ORDER — PANTOPRAZOLE SODIUM 40 MG PO TBEC: 40.0000 mg | DELAYED_RELEASE_TABLET | Freq: Two times a day (BID) | ORAL | Status: DC

## 2016-02-22 NOTE — Progress Notes (Signed)
[name] Progress Note  Chief Complaint: Melena and anemia  Subjective History:  No melena since day of admission.  Hgb came up well with Tfx 1 unit PRBCs yesterday Denies abd pain, chest pain or dyspnea Ate regular food this AM without trouble.  ROS: Cardiovascular:  no chest pain Respiratory: no dyspnea  Objective:  Med list reviewed  Vital signs in last 24 hrs: Filed Vitals:   02/22/16 0400 02/22/16 0800  BP:    Pulse:    Temp: 98.9 F (37.2 C) 98.5 F (36.9 C)  Resp:      Physical Exam   HEENT: sclera anicteric, oral mucosa moist without lesions  Neck: supple, no thyromegaly, JVD or lymphadenopathy  Cardiac: RRR without murmurs, S1S2 heard, no peripheral edema  Pulm: clear to auscultation bilaterally, normal RR and effort noted  Abdomen: soft, no tenderness, with active bowel sounds. No guarding or palpable hepatosplenomegaly  Skin; warm and dry, no jaundice or rash  Recent Labs:   Recent Labs Lab 02/20/16 1053  02/21/16 0314 02/21/16 1526 02/22/16 0628  WBC 15.0*  --   --   --  6.7  HGB 9.0*  < > 7.3* 8.4* 8.8*  HCT 25.5*  < > 21.0* 24.8* 26.4*  PLT 251  --   --   --  204  < > = values in this interval not displayed.  Recent Labs Lab 02/20/16 1053  02/22/16 0628  NA 138  < > 138  K 3.8  < > 3.6  CL 109  < > 104  CO2 20*  < > 29  BUN 40*  < > 8  ALBUMIN 3.5  --   --   ALKPHOS 58  --   --   ALT 13*  --   --   AST 15  --   --   GLUCOSE 160*  < > 98  < > = values in this interval not displayed.  Recent Labs Lab 02/20/16 1053  INR 1.23      @ASSESSMENTPLANBEGIN @ Assessment: Melena Acute blood loss anemia Gastric (anastomotic ) ulcer , exacerbated by NSAID use    Plan:  Home today Twice daily PPI for 6 weeks Oral iron therapy See PCP for check of Hgb and iron levels in 2-3 weeks.  If not coming up well (because GBP), may need IV iron. She does not need regular follow up with me if all of the above is going well.  I  asked her to call my office if there are continued problems and I will see her. Much thanks to medicine service for excellent care.  Lindsay Cruz Pager [phone] Mon-Fri 8a-5p [phone] after 5p, weekends, holidays

## 2016-02-22 NOTE — Discharge Summary (Signed)
Physician Discharge Summary  Lindsay RalphVirginia Cruz BMW:[id number] DOB: 1950-12-15 DOA: 02/20/2016  PCP: Lindsay CossHUNGARLAND,JOHN DAVID, MD  Admit date: 02/20/2016 Discharge date: 02/22/2016  Time spent: 50 minutes  Recommendations for Outpatient Follow-up:  1. F/u Hb   Discharge Condition: stable    Discharge Diagnoses:  Principal Problem:   Gastric ulcer with hemorrhage Active Problems:   Acute blood loss anemia   HTN (hypertension), benign   Arthritis   Depression   History of present illness:  Lindsay Cruz is a 65 y.o. female with medical history significant of gastric bypass, arthritis, HTN, depression who is on Mobic presents with 4 episodes of black stools starting 5 AM. No vomiting. Felt nausea, lightheaded, and dyspneic when standing. She has occasional heart burn and uses Omeprazole PRN but has not been needing it recently. No epigastric pain. No weight loss. No h/o PUD.   Hospital Course:  Principal Problem:  Melena/ tachycardia and lactic acidosis due to acute blood loss -EGD performed on 5/18 - Ulcer found in gastric pouch which was injected- D/c'd Mobic- she knows not to take NSAIDs and to avoid alcohol for now - Protonix BID-   - no further bleeding    Active Problems:  Anemia - due to acute blood loss- have given 2  units of blood  - Hb 9.0>>6.7>>8.8 - no further blood loss  Leukocytosis - likely stress response - no symptoms of infection   HTN (hypertension), benign -SBP in low 100s on admision but now elevated- resumed Lisinopril/ HCTZ   Arthritis - stop Mobic and avoid NSAIDs for now- Tylenol PRN   Depression - Prozac  Procedures:  EGD - Normal esophagus.  - Gastric bypass with a large-sized pouch.Ulcer   with visible vessel suspected to be the culprit for   bleeding. The proximal edge was injected with   submucosal  epinephrine.  - Normal examined jejunum.  - No specimens collected.  Consultations:  GI  Discharge Exam: Filed Weights   02/20/16 1335 02/20/16 1544  Weight: 72.576 kg (160 lb) 72 kg (158 lb 11.7 oz)   Filed Vitals:   02/22/16 0800 02/22/16 0922  BP:  102/69  Pulse:  99  Temp: 98.5 F (36.9 C)   Resp:  11   General: AAO x 3, no distress Cardiovascular: RRR, no murmurs  Respiratory: clear to auscultation bilaterally GI: soft, non-tender, non-distended, bowel sound positive  Discharge Instructions You were cared for by a hospitalist during your hospital stay. If you have any questions about your discharge medications or the care you received while you were in the hospital after you are discharged, you can call the unit and asked to speak with the hospitalist on call if the hospitalist that took care of you is not available. Once you are discharged, your primary care physician will handle any further medical issues. Please note that NO REFILLS for any discharge medications will be authorized once you are discharged, as it is imperative that you return to your primary care physician (or establish a relationship with a primary care physician if you do not have one) for your aftercare needs so that they can reassess your need for medications and monitor your lab values.     Medication List    STOP taking these medications        meloxicam 15 MG tablet  Commonly known as:  MOBIC      TAKE these medications        acetaminophen 500 MG tablet  Commonly known as:  TYLENOL  Take 1,000 mg by mouth every 6 (six) hours as needed for mild pain, moderate pain, fever or headache.     calcium carbonate 600 MG tablet  Commonly known as:  OS-CAL  Take 600 mg by mouth daily.     FLUoxetine 40 MG capsule  Commonly known as:  PROZAC  Take 40 mg by mouth daily.     lisinopril-hydrochlorothiazide 20-12.5 MG tablet  Commonly known as:   PRINZIDE,ZESTORETIC  Take 1 tablet by mouth daily.     pantoprazole 40 MG tablet  Commonly known as:  PROTONIX  Take 1 tablet (40 mg total) by mouth 2 (two) times daily.     Vitamin D 2000 units tablet  Take 2,000 Units by mouth daily.       No Known Allergies    The results of significant diagnostics from this hospitalization (including imaging, microbiology, ancillary and laboratory) are listed below for reference.    Significant Diagnostic Studies: Dg Chest Portable 1 View  02/20/2016  CLINICAL DATA:  New onset of shortness of breath, dark stools today EXAM: PORTABLE CHEST 1 VIEW COMPARISON:  Chest x-ray of 06/24/2009 FINDINGS: No active infiltrate or effusion is seen. Mediastinal and hilar contours are unremarkable. The heart is within normal limits in size. No bony abnormality is seen. IMPRESSION: No active disease. Electronically Signed   By: Dwyane Dee M.D.   On: 02/20/2016 11:21    Microbiology: Recent Results (from the past 240 hour(s))  MRSA PCR Screening     Status: None   Collection Time: 02/20/16  3:53 PM  Result Value Ref Range Status   MRSA by PCR NEGATIVE NEGATIVE Final    Comment:        The GeneXpert MRSA Assay (FDA approved for NASAL specimens only), is one component of a comprehensive MRSA colonization surveillance program. It is not intended to diagnose MRSA infection nor to guide or monitor treatment for MRSA infections.      Labs: Basic Metabolic Panel:  Recent Labs Lab 02/20/16 1053 02/20/16 1112 02/21/16 0314 02/22/16 0628  NA 138 145 141 138  K 3.8 3.7 3.6 3.6  CL 109 106 113* 104  CO2 20*  --  24 29  GLUCOSE 160* 135* 99 98  BUN 40* 36* 21* 8  CREATININE 0.62 0.50 0.64 0.71  CALCIUM 8.6*  --  7.8* 8.5*   Liver Function Tests:  Recent Labs Lab 02/20/16 1053  AST 15  ALT 13*  ALKPHOS 58  BILITOT 0.8  PROT 5.4*  ALBUMIN 3.5    Recent Labs Lab 02/20/16 1053  LIPASE 18   No results for input(s): AMMONIA in the last  168 hours. CBC:  Recent Labs Lab 02/20/16 1053  02/20/16 1551 02/20/16 2022 02/21/16 0314 02/21/16 1526 02/22/16 0628  WBC 15.0*  --   --   --   --   --  6.7  NEUTROABS 12.6*  --   --   --   --   --   --   HGB 9.0*  < > 7.8* 6.7* 7.3* 8.4* 8.8*  HCT 25.5*  < > 23.0* 19.0* 21.0* 24.8* 26.4*  MCV 79.4  --   --   --   --   --  83.8  PLT 251  --   --   --   --   --  204  < > = values in this interval not displayed. Cardiac Enzymes: No results for input(s): CKTOTAL, CKMB, CKMBINDEX, TROPONINI in the last 168 hours.  BNP: BNP (last 3 results) No results for input(s): BNP in the last 8760 hours.  ProBNP (last 3 results) No results for input(s): PROBNP in the last 8760 hours.  CBG: No results for input(s): GLUCAP in the last 168 hours.     SignedCalvert Cantor, MD [facility] 02/22/2016, 2:14 PM

## 2016-02-24 LAB — TYPE AND SCREEN
ABO/RH(D): O NEG
Antibody Screen: NEGATIVE
Unit division: 0
Unit division: 0

## 2016-09-07 IMAGING — DX DG CHEST 1V PORT
1 series · 1 of 1 positions shown · non-contrast
Comparison: Chest x-ray of 06/24/2009

CLINICAL DATA: New onset of shortness of breath, dark stools today

EXAM:
PORTABLE CHEST 1 VIEW

[chest ap]
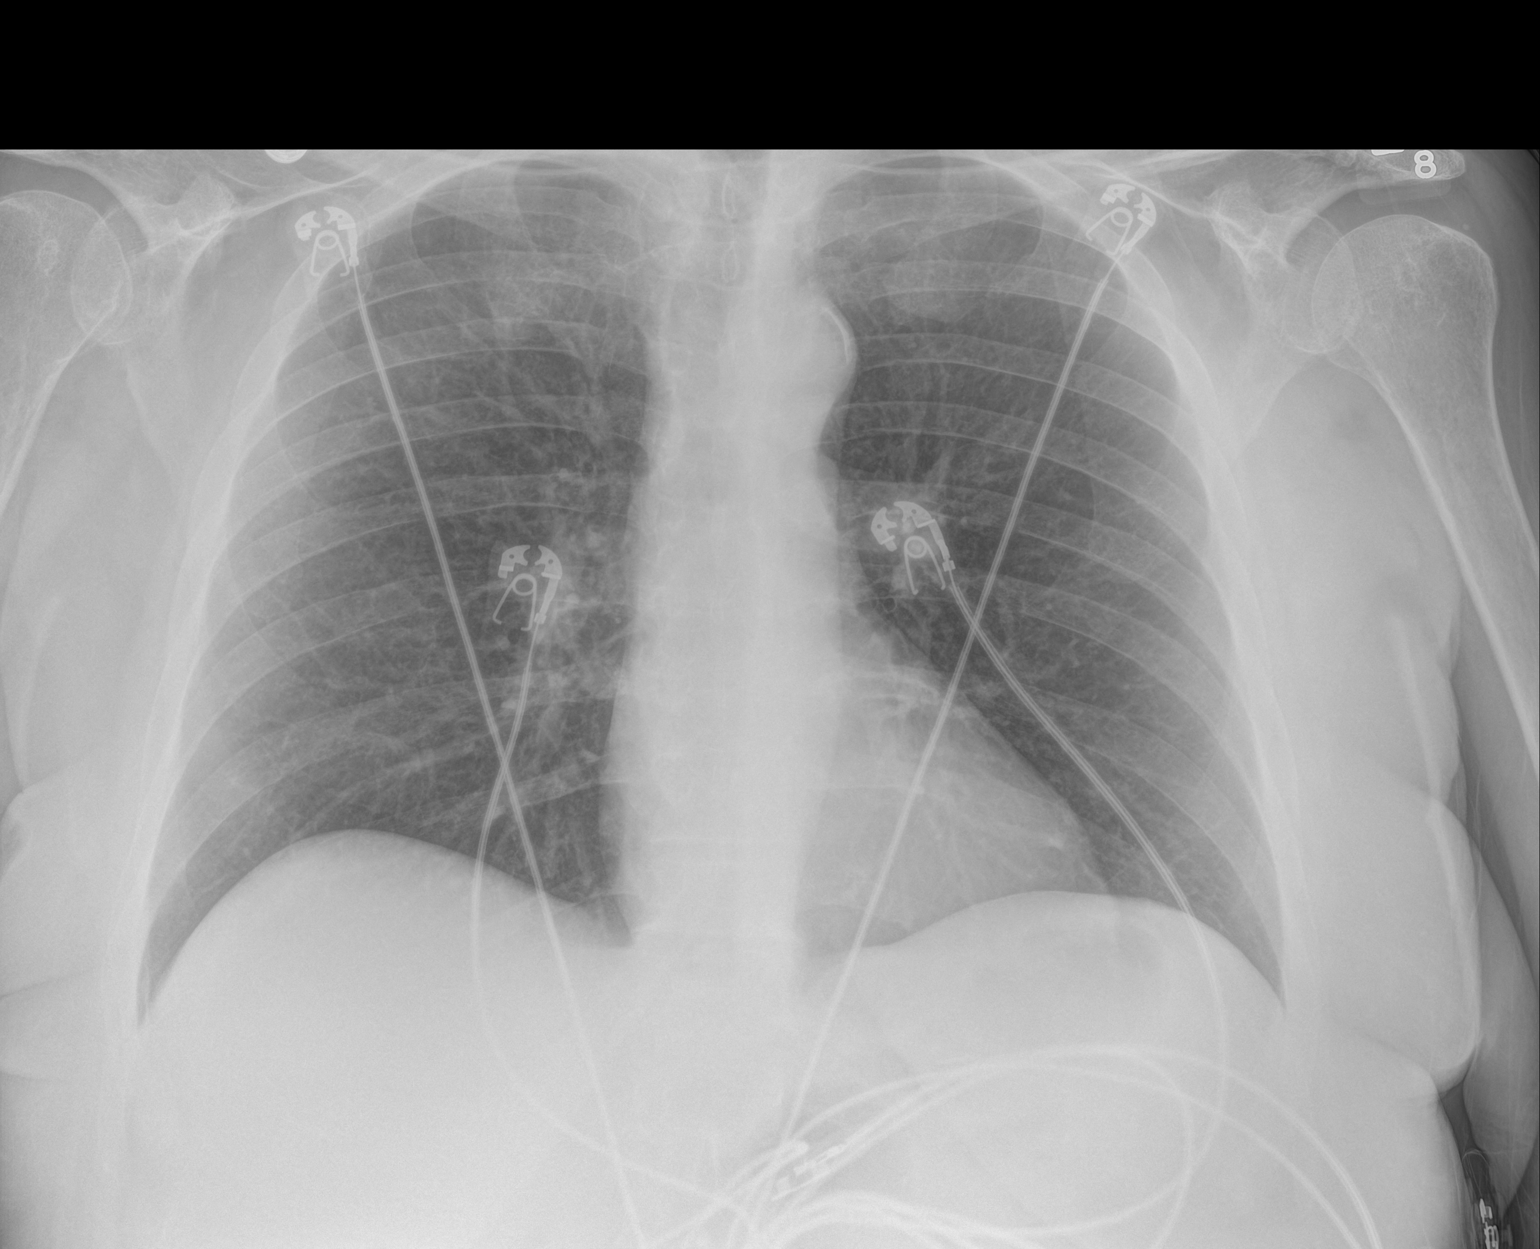

[1 of 1 positions shown; findings below may reference images not displayed]

FINDINGS: No active infiltrate or effusion is seen. Mediastinal and hilar
contours are unremarkable. The heart is within normal limits in
size. No bony abnormality is seen.
IMPRESSION: No active disease.

## 2017-02-23 ENCOUNTER — Ambulatory Visit (INDEPENDENT_AMBULATORY_CARE_PROVIDER_SITE_OTHER): Payer: Medicare Other | Admitting: Neurology

## 2017-02-23 ENCOUNTER — Other Ambulatory Visit (INDEPENDENT_AMBULATORY_CARE_PROVIDER_SITE_OTHER): Payer: Medicare Other

## 2017-02-23 ENCOUNTER — Encounter: Payer: Self-pay | Admitting: Neurology

## 2017-02-23 VITALS — BP 110/66 | HR 74 | Ht 64.0 in | Wt 169.0 lb

## 2017-02-23 DIAGNOSIS — R413 Other amnesia: Secondary | ICD-10-CM

## 2017-02-23 DIAGNOSIS — W19XXXA Unspecified fall, initial encounter: Secondary | ICD-10-CM

## 2017-02-23 DIAGNOSIS — G250 Essential tremor: Secondary | ICD-10-CM

## 2017-02-23 LAB — VITAMIN B12: Vitamin B-12: 343 pg/mL (ref 211–911)

## 2017-02-23 MED ORDER — PRIMIDONE 50 MG PO TABS: 50.0000 mg | ORAL_TABLET | Freq: Every day | ORAL | 2 refills | Status: DC

## 2017-02-23 NOTE — Patient Instructions (Addendum)
1.  To assess memory, we will get a neurocognitive test and check B12 level 2.  To treat tremor, start primidone 50mg  at bedtime.  Contact me prior to refill if you feel we need to increase dose. 3.  Will get MRI and lab reports from Dr. Norval GableHungarland 4.  Follow up in 3 months/after neurocognitive testing.   Mediterranean Diet A Mediterranean diet refers to food and lifestyle choices that are based on the traditions of countries located on the [place]. This way of eating has been shown to help prevent certain conditions and improve outcomes for people who have chronic diseases, like kidney disease and heart disease. What are tips for following this plan? Lifestyle   Cook and eat meals together with your family, when possible.  Drink enough fluid to keep your urine clear or pale yellow.  Be physically active every day. This includes:  Aerobic exercise like running or swimming.  Leisure activities like gardening, walking, or housework.  Get 7-8 hours of sleep each night.  If recommended by your health care provider, drink red wine in moderation. This means 1 glass a day for nonpregnant women and 2 glasses a day for men. A glass of wine equals 5 oz (150 mL). Reading food labels   Check the serving size of packaged foods. For foods such as rice and pasta, the serving size refers to the amount of cooked product, not dry.  Check the total fat in packaged foods. Avoid foods that have saturated fat or trans fats.  Check the ingredients list for added sugars, such as corn syrup. Shopping   At the grocery store, buy most of your food from the areas near the walls of the store. This includes:  Fresh fruits and vegetables (produce).  Grains, beans, nuts, and seeds. Some of these may be available in unpackaged forms or large amounts (in bulk).  Fresh seafood.  Poultry and eggs.  Low-fat dairy products.  Buy whole ingredients instead of prepackaged foods.  Buy fresh fruits and  vegetables in-season from local farmers markets.  Buy frozen fruits and vegetables in resealable bags.  If you do not have access to quality fresh seafood, buy precooked frozen shrimp or canned fish, such as tuna, salmon, or sardines.  Buy small amounts of raw or cooked vegetables, salads, or olives from the deli or salad bar at your store.  Stock your pantry so you always have certain foods on hand, such as olive oil, canned tuna, canned tomatoes, rice, pasta, and beans. Cooking   Cook foods with extra-virgin olive oil instead of using butter or other vegetable oils.  Have meat as a side dish, and have vegetables or grains as your main dish. This means having meat in small portions or adding small amounts of meat to foods like pasta or stew.  Use beans or vegetables instead of meat in common dishes like chili or lasagna.  Experiment with different cooking methods. Try roasting or broiling vegetables instead of steaming or sauteing them.  Add frozen vegetables to soups, stews, pasta, or rice.  Add nuts or seeds for added healthy fat at each meal. You can add these to yogurt, salads, or vegetable dishes.  Marinate fish or vegetables using olive oil, lemon juice, garlic, and fresh herbs. Meal planning   Plan to eat 1 vegetarian meal one day each week. Try to work up to 2 vegetarian meals, if possible.  Eat seafood 2 or more times a week.  Have healthy snacks readily available,  such as:  Vegetable sticks with hummus.  Greek yogurt.  Fruit and nut trail mix.  Eat balanced meals throughout the week. This includes:  Fruit: 2-3 servings a day  Vegetables: 4-5 servings a day  Low-fat dairy: 2 servings a day  Fish, poultry, or lean meat: 1 serving a day  Beans and legumes: 2 or more servings a week  Nuts and seeds: 1-2 servings a day  Whole grains: 6-8 servings a day  Extra-virgin olive oil: 3-4 servings a day  Limit red meat and sweets to only a few servings a  month What are my food choices?  Mediterranean diet  Recommended  Grains: Whole-grain pasta. Brown rice. Bulgar wheat. Polenta. Couscous. Whole-wheat bread. Orpah Cobb.  Vegetables: Artichokes. Beets. Broccoli. Cabbage. Carrots. Eggplant. Green beans. Chard. Kale. Spinach. Onions. Leeks. Peas. Squash. Tomatoes. Peppers. Radishes.  Fruits: Apples. Apricots. Avocado. Berries. Bananas. Cherries. Dates. Figs. Grapes. Lemons. Melon. Oranges. Peaches. Plums. Pomegranate.  Meats and other protein foods: Beans. Almonds. Sunflower seeds. Pine nuts. Peanuts. Cod. Salmon. Scallops. Shrimp. Tuna. Tilapia. Clams. Oysters. Eggs.  Dairy: Low-fat milk. Cheese. Greek yogurt.  Beverages: Water. Red wine. Herbal tea.  Fats and oils: Extra virgin olive oil. Avocado oil. Grape seed oil.  Sweets and desserts: [place] yogurt with honey. Baked apples. Poached pears. Trail mix.  Seasoning and other foods: Basil. Cilantro. Coriander. Cumin. Mint. Parsley. Sage. Rosemary. Tarragon. Garlic. Oregano. Thyme. Pepper. Balsalmic vinegar. Tahini. Hummus. Tomato sauce. Olives. Mushrooms.  Limit these  Grains: Prepackaged pasta or rice dishes. Prepackaged cereal with added sugar.  Vegetables: Deep fried potatoes (french fries).  Fruits: Fruit canned in syrup.  Meats and other protein foods: Beef. Pork. Lamb. Poultry with skin. Hot dogs. Tomasa Blase.  Dairy: Ice cream. Sour cream. Whole milk.  Beverages: Juice. Sugar-sweetened soft drinks. Beer. Liquor and spirits.  Fats and oils: Butter. Canola oil. Vegetable oil. Beef fat (tallow). Lard.  Sweets and desserts: Cookies. Cakes. Pies. Candy.  Seasoning and other foods: Mayonnaise. Premade sauces and marinades.  The items listed may not be a complete list. Talk with your dietitian about what dietary choices are right for you. Summary  The Mediterranean diet includes both food and lifestyle choices.  Eat a variety of fresh fruits and vegetables, beans, nuts,  seeds, and whole grains.  Limit the amount of red meat and sweets that you eat.  Talk with your health care provider about whether it is safe for you to drink red wine in moderation. This means 1 glass a day for nonpregnant women and 2 glasses a day for men. A glass of wine equals 5 oz (150 mL). This information is not intended to replace advice given to you by your health care provider. Make sure you discuss any questions you have with your health care provider. Document Released: 05/14/2016 Document Revised: 06/16/2016 Document Reviewed: 05/14/2016 Elsevier Interactive Patient Education  2017 [employer].

## 2017-02-23 NOTE — Progress Notes (Addendum)
NEUROLOGY CONSULTATION NOTE  Lindsay Cruz MRN: [id number] DOB: 1951/03/02  Referring provider: Dr. Norval Gable Primary care provider: Dr. Norval Gable  Reason for consult:  Tremor, memory loss, falls  HISTORY OF PRESENT ILLNESS: Lindsay Cruz is 66 year old right-handed woman with hypertension, iron deficiency anemia, and depression who presents for memory deficits, tremor and gait abnormality.  History supplemented by PCP note.  Tremor: She has had tremor in both hands for less than a year.  She notices her hand shakes when she is holding on object, such as handing a plate to somebody.  She also notices it sometimes when she writes.  She doesn't notice it when she is eating with utensils.  It is worse when she is made aware of it.  It improves after she drinks a beer.  She denies difficulty with movement or initiating walking after she stands.  Her brother has tremor, but was told it was drug-induced.  Memory deficits: Symptoms started over the past year.  She has short-term memory problems in that she may repeat questions or not remember content of conversations.  Recall is more of a problem.  She has trouble recalling names but not friends or family.  She has no difficulty paying her bills, remembering to take her medication or driving.  She denies family history of dementia.  She did have some college.    Falls: She has had two falls.  With both instances, it occurred due to tripping.  One time, she was walking on stepping stones in open-toed shoes and tripped over a stone.  She denied disequilibrium, dizziness, vertigo or acute leg weakness or numbness.  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  PAST MEDICAL HISTORY: Past Medical History:  Diagnosis Date  . Arthritis   . Depression   . Hypertension     PAST SURGICAL HISTORY: Past Surgical History:  Procedure Laterality Date  . APPENDECTOMY    . CARPAL TUNNEL RELEASE    .  ESOPHAGOGASTRODUODENOSCOPY N/A 02/20/2016   Procedure: ESOPHAGOGASTRODUODENOSCOPY (EGD);  Surgeon: Sherrilyn Rist, MD;  Location: [facility];  Service: Endoscopy;  Laterality: N/A;  . GASTRIC BYPASS    . KNEE SURGERY Right   . TUMMY TUCK  06/20/2009    MEDICATIONS: Current Outpatient Prescriptions on File Prior to Visit  Medication Sig Dispense Refill  . acetaminophen (TYLENOL) 500 MG tablet Take 1,000 mg by mouth every 6 (six) hours as needed for mild pain, moderate pain, fever or headache.    . calcium carbonate (OS-CAL) 600 MG tablet Take 600 mg by mouth daily.    . Cholecalciferol (VITAMIN D) 2000 units tablet Take 2,000 Units by mouth daily.    Marland Kitchen FLUoxetine (PROZAC) 40 MG capsule Take 40 mg by mouth daily.    Marland Kitchen lisinopril-hydrochlorothiazide (PRINZIDE,ZESTORETIC) 20-12.5 MG tablet Take 1 tablet by mouth daily.    . pantoprazole (PROTONIX) 40 MG tablet Take 1 tablet (40 mg total) by mouth 2 (two) times daily. 60 tablet 0   No current facility-administered medications on file prior to visit.     ALLERGIES: Allergies  Allergen Reactions  . Nsaids Other (See Comments)    GI bleed    FAMILY HISTORY: No family history on file.  SOCIAL HISTORY: Social History   Social History  . Marital status: Married    Spouse name: N/A  . Number of children: N/A  . Years of education: N/A   Occupational History  . Not on file.   Social  History Main Topics  . Smoking status: Never Smoker  . Smokeless tobacco: Never Used  . Alcohol use Yes  . Drug use: No  . Sexual activity: No   Other Topics Concern  . Not on file   Social History Narrative  . No narrative on file    REVIEW OF SYSTEMS: Constitutional: No fevers, chills, or sweats, no generalized fatigue, change in appetite Eyes: No visual changes, double vision, eye pain Ear, nose and throat: No hearing loss, ear pain, nasal congestion, sore throat Cardiovascular: No chest pain, palpitations Respiratory:  No shortness  of breath at rest or with exertion, wheezes GastrointestinaI: No nausea, vomiting, diarrhea, abdominal pain, fecal incontinence Genitourinary:  No dysuria, urinary retention or frequency Musculoskeletal:  No neck pain, back pain Integumentary: No rash, pruritus, skin lesions Neurological: as above Psychiatric: No depression, insomnia, anxiety Endocrine: No palpitations, fatigue, diaphoresis, mood swings, change in appetite, change in weight, increased thirst Hematologic/Lymphatic:  No purpura, petechiae. Allergic/Immunologic: no itchy/runny eyes, nasal congestion, recent allergic reactions, rashes  PHYSICAL EXAM: Vitals:   02/23/17 0815  BP: 110/66  Pulse: 74   General: No acute distress.  Patient appears well-groomed.  Head:  Normocephalic/atraumatic Eyes:  fundi examined but not visualized Neck: supple, no paraspinal tenderness, full range of motion Back: No paraspinal tenderness Heart: regular rate and rhythm Lungs: Clear to auscultation bilaterally. Vascular: No carotid bruits. Neurological Exam: Mental status: alert and oriented to person, place, and time, delayed recall 3 of 5 words, and remote memory intact, fund of knowledge intact, attention and concentration intact, speech fluent and not dysarthric, language intact.  Did not complete Trail Making Test correctly (missed the B).  She could not copy a cube on two tries.  She was able to draw a clock with correct time but did not complete all of the numbers. Montreal Cognitive Assessment  02/23/2017  Visuospatial/ Executive (0/5) 2  Naming (0/3) 3  Attention: Read list of digits (0/2) 2  Attention: Read list of letters (0/1) 1  Attention: Serial 7 subtraction starting at 100 (0/3) 3  Language: Repeat phrase (0/2) 2  Language : Fluency (0/1) 1  Abstraction (0/2) 2  Delayed Recall (0/5) 3  Orientation (0/6) 6  Total 25  Adjusted Score (based on education) 25   Cranial nerves: CN I: not tested CN II: pupils equal, round  and reactive to light, visual fields intact CN III, IV, VI:  full range of motion, no nystagmus, no ptosis CN V: facial sensation intact CN VII: upper and lower face symmetric CN VIII: hearing intact CN IX, X: gag intact, uvula midline CN XI: sternocleidomastoid and trapezius muscles intact CN XII: tongue midline Bulk & Tone: normal, no fasciculations.  No cogwheel rigidity Motor:  5/5 throughout.  Kinetic tremor in both hands.  No bradykinesia.  Able to draw Archimedes swirl without difficulty. Sensation:  Pinprick and vibration sensation intact. Deep Tendon Reflexes:  2+ throughout, toes downgoing.  Finger to nose testing:  Without dysmetria.  Heel to shin:  Without dysmetria.  Gait:  Antalgic gait.  Able to turn but difficulty with tandem walk. Romberg negative.  IMPRESSION: 1.  Benign essential tremor 2.  Memory deficits.  She did exhibit some minor recall deficits but mostly visuospatial and executive functioning deficits.   3.  Falls, mechanical.  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  No evidence of neuropathy, weakness, myelopathy, inner ear or intracranial etiology.  PLAN: 1.  Will start primidone 50mg  at bedtime  for tremor.  We can titrate dose as needed. 2.  Will check B12 and order neuropsychological testing to further evaluate cognitive deficits. 3.  Will obtain MRI report and labs from PCP office (results are not available to me). 4.  Follow up in 3 months.  Thank you for allowing me to take part in the care of this patient.  Shon Millet, DO  CC: Maximiano Coss, MD  ADDENDUM:   01/07/17 MRI of brain without contrast: chronic small vessel ischemic changes.  No acute abnormality. 12/28/16 Labs:  CMP with Na 139, K 4.1, Cl 103, CO2 28, glucose 88, BUN 14, Cr 0.70, total bili 0.3, ALP 82, AST 15, ALT 14; TSH 1.47 Shon Millet, DO

## 2017-02-23 NOTE — Addendum Note (Signed)
Addended by: Vivien RotaRIVER, Adonica Fukushima H on: 1/32/44015/22/2018 09:14 AM   Modules accepted: Orders

## 2017-02-24 ENCOUNTER — Telehealth: Payer: Self-pay | Admitting: Neurology

## 2017-02-24 NOTE — Telephone Encounter (Signed)
LMOM making patient aware B12 okay.

## 2017-02-24 NOTE — Telephone Encounter (Signed)
-----   Message from Drema DallasAdam R Jaffe, DO sent at 02/24/2017  2:32 PM EDT ----- B12 is okay

## 2017-04-26 ENCOUNTER — Encounter: Payer: Self-pay | Admitting: Psychology

## 2017-04-26 ENCOUNTER — Ambulatory Visit (INDEPENDENT_AMBULATORY_CARE_PROVIDER_SITE_OTHER): Payer: Medicare Other | Admitting: Psychology

## 2017-04-26 DIAGNOSIS — R413 Other amnesia: Secondary | ICD-10-CM | POA: Diagnosis not present

## 2017-04-26 NOTE — Progress Notes (Signed)
   Neuropsychology Note  Lindsay Cruz came in today for 1 hour of neuropsychological testing with technician, Lindsay Cruz, BS, under the supervision of Dr. Elvis CoilMaryBeth Bailar. The patient did not appear overtly distressed by the testing session, per behavioral observation or via self-report to the technician. Rest breaks were offered. Lindsay Cruz will return within 2 weeks for a feedback session with Dr. [name] at which time her test performances, clinical impressions and treatment recommendations will be reviewed in detail. The patient understands she can contact our office should she require our assistance before this time.  Full report to follow.

## 2017-04-26 NOTE — Progress Notes (Signed)
NEUROPSYCHOLOGICAL INTERVIEW (CPT: [id number])  Name: Lindsay Cruz Date of Birth: 02-20-1951 Date of Interview: 04/26/2017  Reason for Referral:  Lindsay Cruz is a 66 y.o. right handed female who is referred for neuropsychological evaluation by Dr. Shon Millet of [facility] due to concerns about memory loss. This patient is accompanied in the office by her daughter, [name], who supplements the history.  History of Presenting Problem:  Mrs. Lindsay Cruz was seen by Dr. Everlena Cooper for neurologic consultation on 02/23/2017. She was diagnosed with essential tremor and started on primidone. She also reported memory decline. MoCA was 25/30. MRI of the brain from 01/07/2017 reportedly showed chronic small vessel ischemic changes and no acute abnormality.   At today's visit, the patient and her daughter report they both notice significant decline in memory abilities. Her daughter reported mild memory changes over the past few years with more noticeable decline over the last year. The patient reported that she forgets names of people and has difficulty recalling which of her two children she told information to. She lives with her husband and continues to manage all instrumental ADLs. She denied any difficulty with driving, managing medications for herself and her husband, managing the finances, and completing daily household tasks such as cooking and cleaning. She manages her appointments and her husband's appointments, but she has missed some hair appointments. Of note, her father in law who has Alzheimer's disease was living with them for the past 4 months and she was providing care for him as well, which was very stressful. They just moved him to Memory Care a week ago.  The patient's daughter reports that Lindsay Cruz more frequently answers questions quickly and incorrectly, but is very confident of her response. She reported that "over half of the information" that the patient tells her granddaughter is  inaccurate, for example. Additionally, [name] reported that on one occasion she told the patient a story, and then the next day the patient told [name] the story as if it was her own and actually inserted herself into the story. [name] also reported there may be some difficulty with visual-spatial skills and navigation. Specifically, just recently they went in a restaurant in one door and then the patient led their group out of the restaurant out a different door and all the way around the building. [name] states "she can get from point A to point B but not in a straight line".   Upon direct questioning, the patient and her daughter reported the following with regard to current cognitive functioning:   Forgetting recent conversations/events: Yes for conversations, generally no for events Repeating statements/questions: No per daughter Misplacing/losing items: No Forgetting appointments or other obligations: Has forgotten hair appointments only Forgetting to take medications: No  Difficulty concentrating: No Starting but not finishing tasks: No Distracted easily: No Processing information more slowly: No  Word-finding difficulty: Yes Comprehension difficulty: No  Getting lost when driving: No Making wrong turns when driving: No Uncertain about directions when driving or passenger: No   Physically, the patient reports she has been feeling well in general. She has not had any falls since seeing Dr. Everlena Cooper. She had had two falls prior to seeing him, but these sounded mechanical in nature. She endorses occasional balance difficulty. She denied any sleep difficulty. She is more prone to fall asleep during the day but this is not due to fatigue, more likely due to boredom. She denied any change in appetite. She had gastric bypass surgery in 2008.  Her mood has  been "stressed" related to caring for her father-in-law, but she denies any depression. She reports her mood is good and she lives "to see  and play with my grandkids".   There has been no change in personality, per her daughter.  Psychiatric history is positive for depression around age 66 due to situational stressors. She was started on Effexor at that time; it was switched to Prozac when she retired due to cost. She participated in psychotherapy many years ago. 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 She denied suicidal ideation or intention. She denied any history of hallucinations or psychosis.  There is no family history of dementia other than in one great-grandmother who had "hardening of the arteries".    Social History: Born/Raised: [place]. 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 Marital history: Married x45 years. 2 children, 5 grands. The [place] lives in [place], [place] with her husband; both her children live in [place]. Alcohol: 2 beers/week on average. Not a daily drinker. Tobacco: Former smoker; quit 38 years ago  SA: No   Medical History: Past Medical History:  Diagnosis Date  . Arthritis   . Depression   . Hypertension   Benign essential tremor GERD   Current Medications:  Outpatient Encounter Prescriptions as of 04/26/2017  Medication Sig  . acetaminophen (TYLENOL) 500 MG tablet Take 1,000 mg by mouth every 6 (six) hours as needed for mild pain, moderate pain, fever or headache.  . calcium carbonate (OS-CAL) 600 MG tablet Take 600 mg by mouth daily.  . Cholecalciferol (VITAMIN D) 2000 units tablet Take 2,000 Units by mouth daily.  Marland Kitchen. FLUoxetine (PROZAC) 40 MG capsule Take 40 mg by mouth daily.  Marland Kitchen. lisinopril-hydrochlorothiazide (PRINZIDE,ZESTORETIC) 20-12.5 MG tablet Take 1 tablet by mouth daily.  . pantoprazole (PROTONIX) 40 MG tablet Take 1 tablet (40 mg total) by mouth 2 (two) times daily.  .  primidone (MYSOLINE) 50 MG tablet Take 1 tablet (50 mg total) by mouth at bedtime.   No facility-administered encounter medications on file as of 04/26/2017.      Behavioral Observations:   Appearance: Neatly and appropriately dressed and groomed Gait: Ambulated independently, no gross abnormalities observed Speech: Fluent; normal rate, rhythm and volume. No significant word finding difficulty observed during conversational speech. Thought process: Linear, goal directed Affect: Full, euthymic Interpersonal: Pleasant, appropriate   TESTING: There is medical necessity to proceed with neuropsychological assessment as the results will be used to aid in differential diagnosis and clinical decision-making and to inform specific treatment recommendations. Per the patient, her daughter and medical records reviewed, there has been a change in cognitive functioning and a reasonable suspicion of neurocognitive disorder.  Following the clinical interview, the patient completed a full battery of neuropsychological testing with my psychometrician under my supervision.   PLAN: The patient will return to see me for a follow-up session at which time her test performances and my impressions and treatment recommendations will be reviewed in detail.  Full report to follow.

## 2017-05-11 NOTE — Progress Notes (Signed)
NEUROPSYCHOLOGICAL EVALUATION   Name:    Lindsay Cruz  Date of Birth:   Nov 13, 1950 Date of Interview:  04/26/2017 Date of Testing:  04/26/2017   Date of Feedback:  05/13/2017       Background Information:  Reason for Referral:  Lindsay Cruz is a 66 y.o. right handed female referred by Dr. Shon Millet to assess her current level of cognitive functioning and assist in differential diagnosis. The current evaluation consisted of a review of available medical records, an interview with the patient and her daughter, Lindsay Cruz, and the completion of a neuropsychological testing battery. Informed consent was obtained.   History of Presenting Problem:  Lindsay Cruz was seen by Dr. Everlena Cooper for neurologic consultation on 02/23/2017. She was diagnosed with essential tremor and started on primidone. She also reported memory decline. MoCA was 25/30. MRI of the brain from 01/07/2017 reportedly showed chronic small vessel ischemic changes and no acute abnormality.   At today's visit, the patient and her daughter report they both notice significant decline in memory abilities. Her daughter reported mild memory changes over the past few years with more noticeable decline over the last year. The patient reported that she forgets names of people and has difficulty recalling which of her two children she told information to. She lives with her husband and continues to manage all instrumental ADLs. She denied any difficulty with driving, managing medications for herself and her husband, managing the finances, and completing daily household tasks such as cooking and cleaning. She manages her appointments and her husband's appointments, but she has missed some hair appointments. Of note, her father in law who has Alzheimer's disease was living with them for the past 4 months and she was providing care for him as well, which was very stressful. They just moved him to Memory Care a week ago.  The patient's daughter  reports that [name] more frequently answers questions quickly and incorrectly, but is very confident of her response. She reported that "over half of the information" that the patient tells her granddaughter is inaccurate, for example. Additionally, Lindsay Cruz reported that on one occasion she told the patient a story, and then the next day the patient told Lindsay Cruz the story as if it was her own and actually inserted herself into the story. Lindsay Cruz also reported there may be some difficulty with visual-spatial skills and navigation. Specifically, just recently they went in a restaurant in one door and then the patient led their group out of the restaurant out a different door and all the way around the building. Lindsay Cruz states "she can get from point A to point B but not in a straight line".   Upon direct questioning, the patient and her daughter reported the following with regard to current cognitive functioning:   Forgetting recent conversations/events: Yes for conversations, generally no for events Repeating statements/questions: No per daughter Misplacing/losing items: No Forgetting appointments or other obligations: Has forgotten hair appointments only Forgetting to take medications: No  Difficulty concentrating: No Starting but not finishing tasks: No Distracted easily: No Processing information more slowly: No  Word-finding difficulty: Yes Comprehension difficulty: No  Getting lost when driving: No Making wrong turns when driving: No Uncertain about directions when driving or passenger: No   Physically, the patient reports she has been feeling well in general. She has not had any falls since seeing Dr. Everlena Cooper. She had had two falls prior to seeing him, but these sounded mechanical in nature. She endorses occasional balance difficulty.  She denied any sleep difficulty. She is more prone to fall asleep during the day but this is not due to fatigue, more likely due to boredom. She  denied any change in appetite. She had gastric bypass surgery in 2008.  Her mood has been "stressed" related to caring for her father-in-law, but she denies any depression. She reports her mood is good and she lives "to see and play with my grandkids".   There has been no change in personality, per her daughter.  Psychiatric history is positive for depression around age 53 due to situational stressors. She was started on Effexor at that time; it was switched to Prozac when she retired due to cost. She participated in psychotherapy many years ago. 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 She denied suicidal ideation or intention. She denied any history of hallucinations or psychosis.  There is no family history of dementia other than in one great-grandmother who had "hardening of the arteries".    Social History: Born[place]Raised: [place]. 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 Marital history: Married x45 years. 2 children, 5 grands. The patient lives in [place], [place] with her husband; both her children live in [place]. Alcohol: 2 beers/week on average. Not a daily drinker. Tobacco: Former smoker; quit 38 years ago  SA: No   Medical History:  Past Medical History:  Diagnosis Date  . Arthritis   . Depression   . Hypertension     Current medications:  Outpatient Encounter Prescriptions as of 05/13/2017  Medication Sig  . acetaminophen (TYLENOL) 500 MG tablet Take 1,000 mg by mouth every 6 (six) hours as needed for mild pain, moderate pain, fever or headache.  . calcium carbonate (OS-CAL) 600 MG tablet Take 600 mg by mouth daily.  . Cholecalciferol (VITAMIN D) 2000 units tablet Take 2,000 Units by mouth daily.  Marland Kitchen esomeprazole (NEXIUM) 40 MG capsule Take 40 mg by mouth daily.  Marland Kitchen  FLUoxetine (PROZAC) 40 MG capsule Take 40 mg by mouth daily.  Marland Kitchen lisinopril-hydrochlorothiazide (PRINZIDE,ZESTORETIC) 20-12.5 MG tablet Take 1 tablet by mouth daily.  . pantoprazole (PROTONIX) 40 MG tablet Take 1 tablet (40 mg total) by mouth 2 (two) times daily.  . primidone (MYSOLINE) 50 MG tablet Take 1 tablet (50 mg total) by mouth at bedtime.   No facility-administered encounter medications on file as of 05/13/2017.      Current Examination:  Behavioral Observations:  Appearance: Neatly and appropriately dressed and groomed Gait: Ambulated independently, no gross abnormalities observed Speech: Fluent; normal rate, rhythm and volume. No significant word finding difficulty observed during conversational speech. Thought process: Linear, goal directed Affect: Full, euthymic Interpersonal: Pleasant, appropriate Orientation: Oriented to all spheres. Accurately named the current President and his predecessor.   Tests Administered: . Test of Premorbid Functioning (TOPF) . Wechsler Adult Intelligence Scale-Fourth Edition (WAIS-IV): Similarities, Clinical cytogeneticist, Coding and Digit Span subtests . Wechsler Memory Scale-Fourth Edition (WMS-IV) Older Adult Version (ages 77-90): Logical Memory I, II and Recognition subtests  . DIRECTV Verbal Learning Test - 2nd Edition (CVLT-2) Short Form . Repeatable Battery for the Assessment of Neuropsychological Status (RBANS) Form A:  Figure Copy and Recall subtests and Semantic Fluency subtest . Neuropsychological Assessment Battery (NAB) Language Module, Form 1: Naming subtest . Boston Diagnostic Aphasia Examination: Complex Ideational Material subtest . Controlled Oral Word Association Test (COWAT) .  Trail Making Test A and B . Clock drawing test . Geriatric Depression Scale (GDS) 15 Item . Generalized Anxiety Disorder - 7 item screener (GAD-7)  Test Results: Note: Standardized scores are presented only for use by appropriately trained professionals and  to allow for any future test-retest comparison. These scores should not be interpreted without consideration of all the information that is contained in the rest of the report. The most recent standardization samples from the test publisher or other sources were used whenever possible to derive standard scores; scores were corrected for age, gender, ethnicity and education when available.   Test Scores:  Test Name Raw Score Standardized Score Descriptor  TOPF 52/70 SS= 109 Average  WAIS-IV Subtests     Similarities 22/36 ss= 9 Average  Block Design 32/66 ss= 10 Average  Coding 48/135 ss= 9 Average  Digit Span Forward 12/16 ss= 13 High average  Digit Span Backward 10/16 ss= 12 High average  WMS-IV Subtests     LM I 39/53 ss= 13 High average  LM II 26/39 ss= 13 High average  LM II Recognition 20/23 Cum %: 51-75 WNL  RBANS Subtests     Figure Copy 18/20 Z= -0.1 Average  Figure Recall 16/20 Z= 0.6 Average  Semantic Fluency 20 Z= -0.2 Average  CVLT-II Scores     Trial 1 5/9 Z= -0.5 Average  Trial 4 8/9 Z= 0 Average  Trials 1-4 total 27/36 T= 48 Average  SD Free Recall 7/9 Z= 0 Average  LD Free Recall 7/9 Z= 0 Average  LD Cued Recall 8/9 Z= 0.5 Average  Recognition Discriminability 8/9 hits, 0 false positives Z= 0 Average  Forced Choice Recognition 9/9  WNL  NAB Language subtest     Naming 30/31 T= 56 Average  BDAE Subtest     Complex Ideational Material 11/12  WNL  COWAT-FAS 35 T= 44 Average  COWAT-Animals 20 T= 54 Average  Trail Making Test A  33" 1 error T= 52 Average  Trail Making Test B  89" 0 errors T= 48 Average  Clock Drawing   WNL  GDS-15 2/15  WNL  GAD-7 14/21  Moderate      Description of Test Results:  Premorbid verbal intellectual abilities were estimated to have been within the average to high average range based on a test of word reading. Psychomotor processing speed was average. Auditory attention and working memory were high average. Visual-spatial  construction was average. Language abilities assessed were intact. Specifically, confrontation naming was average, and semantic verbal fluency was average. Auditory comprehension of complex ideational material was within normal limits. With regard to verbal memory, encoding and acquisition of non-contextual information (i.e., word list) was average. After a brief distracter task, free recall was average. After a delay, free recall was average. Cued recall was average. Performance on a yes/no recognition task was average. On another verbal memory test, encoding and acquisition of contextual auditory information (i.e., short stories) was high average. After a delay, free recall was high average. Performance on a yes/no recognition task was intact. With regard to non-verbal memory, delayed free recall of visual information was average. Executive functioning was intact overall. Mental flexibility and set-shifting were average on Trails B. Verbal fluency with phonemic search restrictions was average. Verbal abstract reasoning was average. Performance on a clock drawing task was normal. On a self-report measure of mood, the patient's responses were not indicative of clinically significant depression at the present time. On a self-report measure of anxiety, the patient  endorsed clinically significant generalized anxiety characterized by nervousness, difficulty relaxing, inability to control worrying, excessive worrying, and restlessness. However, the patient noted that these symptoms reflect the time her father in law with dementia was living in their home.    Clinical Impressions: No cognitive disorder appreciated on formal testing. Results of cognitive testing were entirely within normal limits and commensurate with estimated premorbid intellectual abilities. Specifically, processing speed, auditory attention, language, visual spatial skills, learning and memory, and executive functioning were all normal, with all  scores falling in the average to high average range. There were no impaired performances on testing. There is no evidence to suggest the presence of a neurocognitive disorder, including MCI or dementia, at this time. There is also no evidence of a primary psychiatric disorder. It is most likely that the patient's subjective cognitive complaints are secondary to normal aging and/or psychosocial stressors.   Recommendations/Plan: Based on the findings of the present evaluation, the following recommendations are offered:  --The patient was reassured that her testing results are entirely within normal limits and not indicative of any underlying cognitive disorder or dementia.  --The impact of stress on cognitive functioning was reviewed. She will benefit from regular stress management and self care activities. I suggested counseling but she feels that would only add to her stress right now as it would be one more thing to schedule.  --SSRI for anxiety could also be considered, especially if this persists despite improved level of stress. --These test results will provide a nice baseline for future comparison if ever needed.   Feedback to Patient: [name] returned for a feedback appointment on 05/13/2017 to review the results of her neuropsychological evaluation with this provider. 10 minutes face-to-face time was spent reviewing her test results, my impressions and my recommendations as detailed above.    Total time spent on this patient's case: [id number] unit for interview with psychologist; [phone] units of testing by psychometrician under psychologist's supervision; [phone] units for medical record review, scoring of neuropsychological tests, interpretation of test results, preparation of this report, and review of results to the patient by psychologist.      Thank you for your referral of Lindsay Cruz. Please feel free to contact me if you have any questions or concerns regarding this  report.

## 2017-05-13 ENCOUNTER — Encounter: Payer: Self-pay | Admitting: Psychology

## 2017-05-13 ENCOUNTER — Ambulatory Visit (INDEPENDENT_AMBULATORY_CARE_PROVIDER_SITE_OTHER): Payer: Medicare Other | Admitting: Psychology

## 2017-05-13 DIAGNOSIS — R413 Other amnesia: Secondary | ICD-10-CM

## 2017-05-13 NOTE — Patient Instructions (Signed)
Fortunately, results of cognitive testing were entirely within normal limits and NOT indicative of any cognitive disorder or dementia including Alzheimer's disease.   It is most likely the case that your cognitive symptoms in daily life are/were due to significant stress.   How STRESS and ANXIETY affect your ability to pay attention  . In times of stress, it is common to have difficulty concentrating, paying attention and making decisions . Various anxiety disorders also interfere with attention and concentration . Problems with attention and concentration can disrupt the process of learning and making new memories, which can make it seem like there is a problem with your memory. In your daily life, you may experience this disruption as forgetting names and appointments, misplacing items, and needing to make lists for shopping and errands  . Regardless of the test scores on neuropsychological evaluation, psychosocial stress can interfere with the ability to make use of your cognitive resources and function optimally across settings such as work or school, maintaining the home and responsibilities, and personal relationships  . Assessment and monitoring of symptoms o A thorough psychological evaluation is important in order to assess current symptoms and determine the best treatment o The assessment provides a starting point to monitor symptom improvement  . Treatment o Psychotherapy: Individual and group psychotherapy have been shown to be effective in improving the symptoms of depression and anxiety disorders, and typically cognitive difficulties improve with symptom reduction o Mindfulness based interventions have been shown to be effective in improving stress management o Behavioral strategies for improving attention and concentration in your daily life may be helpful.   . Pleasurable activities, exercise, daily structure o People who have high stress levels often stop engaging in activities  that they enjoy, which can have an additional negative effect on their mood. It is recommended that all patients set aside time in their schedule to engage in pleasurable activities because this has been shown to help alleviate many types of symptoms. Examples of pleasurable activities include taking a walk/spending time outdoors, reading or seeing a movie, spending time with friends and/or family, listening to music, and volunteering o In addition to the numerous physical health benefits of exercise, there are many psychological and cognitive benefits as well.  o Maintaining a regular schedule is also recommended, such as setting regular sleep/wake times and scheduling activities such as social outings

## 2017-06-08 ENCOUNTER — Other Ambulatory Visit: Payer: Self-pay | Admitting: Neurology

## 2017-06-15 ENCOUNTER — Encounter: Payer: Self-pay | Admitting: Neurology

## 2017-06-15 ENCOUNTER — Ambulatory Visit (INDEPENDENT_AMBULATORY_CARE_PROVIDER_SITE_OTHER): Payer: Medicare Other | Admitting: Neurology

## 2017-06-15 VITALS — BP 118/72 | HR 66 | Ht 64.0 in | Wt 167.6 lb

## 2017-06-15 DIAGNOSIS — G250 Essential tremor: Secondary | ICD-10-CM | POA: Diagnosis not present

## 2017-06-15 MED ORDER — PRIMIDONE 50 MG PO TABS: 100.0000 mg | ORAL_TABLET | Freq: Every day | ORAL | 2 refills | Status: DC

## 2017-06-15 NOTE — Patient Instructions (Signed)
1.  We will increase primidone  tablet to TWO tablets at bedtime.  If needed, we can increase dose in 2 weeks (contact me). 2.  Follow up in 4 months.

## 2017-06-15 NOTE — Progress Notes (Signed)
NEUROLOGY FOLLOW UP OFFICE NOTE  [place] [id number]  HISTORY OF PRESENT ILLNESS: Lindsay RalphVirginia Cruz is 66 year old right-handed woman with hypertension, iron deficiency anemia, and depression who follows up for memory deficits, essential tremor and gait abnormality.     ESSENTIAL TREMOR: Update:  She was started on primidone 50mg  at bedtime.  Tremor is still present.  Some days are worse than others.  History:  She has had tremor in both hands for less than a year.  She notices her hand shakes when she is holding on object, such as handing a plate to somebody.  She also notices it sometimes when she writes.  She doesn't notice it when she is eating with utensils.  It is worse when she is made aware of it.  It improves after she drinks a beer.  She denies difficulty with movement or initiating walking after she stands.  Her brother has tremor, but was told it was drug-induced.   MEMORY DEFICITS: Update:  B12 level from 02/23/17 was 343.  She underwent neuropsychological testing on 04/26/17, which revealed no cognitive disorder.  History:  Symptoms started over the past year.  She has short-term memory problems in that she may repeat questions or not remember content of conversations.  Recall is more of a problem.  She has trouble recalling names but not friends or family.  She has no difficulty paying her bills, remembering to take her medication or driving.  She denies family history of dementia.  She did have some college.    01/07/17 MRI of brain without contrast: chronic small vessel ischemic changes.  No acute abnormality.  PAST MEDICAL HISTORY: Past Medical History:  Diagnosis Date  . Arthritis   . Depression   . Hypertension     MEDICATIONS: Current Outpatient Prescriptions on File Prior to Visit  Medication Sig Dispense Refill  . acetaminophen (TYLENOL) 500 MG tablet Take 1,000 mg by mouth every 6 (six) hours as needed for mild pain, moderate pain, fever or headache.    .  calcium carbonate (OS-CAL) 600 MG tablet Take 600 mg by mouth daily.    . Cholecalciferol (VITAMIN D) 2000 units tablet Take 2,000 Units by mouth daily.    Marland Kitchen. esomeprazole (NEXIUM) 40 MG capsule Take 40 mg by mouth daily.    Marland Kitchen. FLUoxetine (PROZAC) 40 MG capsule Take 40 mg by mouth daily.    Marland Kitchen. lisinopril-hydrochlorothiazide (PRINZIDE,ZESTORETIC) 20-12.5 MG tablet Take 1 tablet by mouth daily.    . pantoprazole (PROTONIX) 40 MG tablet Take 1 tablet (40 mg total) by mouth 2 (two) times daily. 60 tablet 0   No current facility-administered medications on file prior to visit.     ALLERGIES: Allergies  Allergen Reactions  . Nsaids Other (See Comments)    GI bleed    FAMILY HISTORY: No family history on file.  SOCIAL HISTORY: Social History   Social History  . Marital status: Married    Spouse name: N/A  . Number of children: N/A  . Years of education: N/A   Occupational History  . Not on file.   Social History Main Topics  . Smoking status: Former Smoker    Types: Cigarettes    Quit date: 10/05/1978  . Smokeless tobacco: Never Used  . Alcohol use Yes     Comment: 2 beers/week on average.  . Drug use: No  . Sexual activity: No   Other Topics Concern  . Not on file   Social History Narrative  . No  narrative on file    REVIEW OF SYSTEMS: Constitutional: No fevers, chills, or sweats, no generalized fatigue, change in appetite Eyes: No visual changes, double vision, eye pain Ear, nose and throat: No hearing loss, ear pain, nasal congestion, sore throat Cardiovascular: No chest pain, palpitations Respiratory:  No shortness of breath at rest or with exertion, wheezes GastrointestinaI: No nausea, vomiting, diarrhea, abdominal pain, fecal incontinence Genitourinary:  No dysuria, urinary retention or frequency Musculoskeletal:  No neck pain, back pain Integumentary: No rash, pruritus, skin lesions Neurological: as above Psychiatric: No depression, insomnia, anxiety Endocrine:  No palpitations, fatigue, diaphoresis, mood swings, change in appetite, change in weight, increased thirst Hematologic/Lymphatic:  No purpura, petechiae. Allergic/Immunologic: no itchy/runny eyes, nasal congestion, recent allergic reactions, rashes  PHYSICAL EXAM: Vitals:   06/15/17 0953  BP: 118/72  Pulse: 66  SpO2: (!) 84%   General: No acute distress.  Patient appears well-groomed.  Head:  Normocephalic/atraumatic Eyes:  Fundi examined but not visualized Neck: supple, no paraspinal tenderness, full range of motion Heart:  Regular rate and rhythm Lungs:  Clear to auscultation bilaterally Back: No paraspinal tenderness Neurological Exam: alert and oriented to person, place, and time. Attention span and concentration intact, recent and remote memory intact, fund of knowledge intact.  Speech fluent and not dysarthric, language intact.  CN II-XII intact. Bulk and tone normal, muscle strength 5/5 throughout.  Sensation to light touch, temperature and vibration intact.  Deep tendon reflexes 2+ throughout except absent in ankles, toes downgoing.  Finger to nose with fine postural and kinetic tremor but no dysmetria; heel to shin testing intact.  Gait antalgic, Romberg negative.  IMPRESSION: Benign essential tremor.  PLAN: 1. Increase primidone to  at bedtime.  If tremor not improved, she is to contact us in 2 weeks and we can increase dose to  at beditme. 2.  Follow up in 4 months.  18 minutes spent face to face with patient, over 50% spent discussing management.  Shon Millet, DO  CC:  Maximiano Coss, MD

## 2017-07-12 ENCOUNTER — Telehealth: Payer: Self-pay | Admitting: Neurology

## 2017-07-12 NOTE — Telephone Encounter (Signed)
Spoke with pt, advsd her she may increase her primidone to  at bedtime, and to contact us in 2 wks if needed

## 2017-07-12 NOTE — Telephone Encounter (Signed)
Pt called and said she is still having the tremors even after the medication that was prescribed

## 2017-07-26 ENCOUNTER — Telehealth: Payer: Self-pay | Admitting: Neurology

## 2017-07-26 ENCOUNTER — Other Ambulatory Visit: Payer: Self-pay

## 2017-07-26 MED ORDER — PRIMIDONE 50 MG PO TABS
150.0000 mg | ORAL_TABLET | Freq: Every day | ORAL | 3 refills | Status: DC
Start: 2017-07-26 — End: 2017-11-30

## 2017-07-26 NOTE — Telephone Encounter (Signed)
Patient called to have her Prescription information updated with [facility] [place]. She is now taking 3 Primidone a day. She needs that on the new prescription information. Please Advise. Thanks

## 2017-10-21 ENCOUNTER — Ambulatory Visit: Payer: Medicare Other | Admitting: Neurology

## 2017-11-30 ENCOUNTER — Other Ambulatory Visit: Payer: Self-pay | Admitting: Neurology

## 2018-01-31 ENCOUNTER — Telehealth: Payer: Self-pay | Admitting: Neurology

## 2018-01-31 NOTE — Telephone Encounter (Signed)
Patient called to let Dr. Arbutus Leas know that her Tremors are worse. Please call. Thanks

## 2018-02-01 NOTE — Telephone Encounter (Signed)
This is a Dr. Everlena Cooper patient. Sandi - can you please call patient.

## 2018-02-01 NOTE — Telephone Encounter (Signed)
Called and spoke with Pt. Advsd her to increase Primidone to  QHS. We had increased to  in Oct and advsd her to call back if needed.

## 2018-02-16 ENCOUNTER — Telehealth: Payer: Self-pay | Admitting: Neurology

## 2018-02-16 NOTE — Telephone Encounter (Signed)
Pt called and said her primidone was changed from  to , tremors are better but pt is experiencing dizziness and fell this morning

## 2018-02-16 NOTE — Telephone Encounter (Signed)
Called and LMOVM for Pt to return my call 

## 2018-02-17 NOTE — Telephone Encounter (Signed)
Called and spoke with Pt. She has noticed since increasing Primidone to , she is dizzy upon standing or looking down.No other symptoms. She stumbled into the night stand yesterday, no injuries. She states the tremors are much improved on the increased dose.  Advised her to go back to  for 1 week to see if dizziness improves. She is aware to use caution when standing or looking down. Pt will call in one week to update.

## 2018-03-22 ENCOUNTER — Telehealth: Payer: Self-pay | Admitting: Neurology

## 2018-03-22 MED ORDER — PRIMIDONE 50 MG PO TABS: 200.0000 mg | ORAL_TABLET | Freq: Every day | ORAL | 3 refills | Status: DC

## 2018-03-30 ENCOUNTER — Other Ambulatory Visit: Payer: Self-pay | Admitting: Neurology

## 2018-08-08 ENCOUNTER — Other Ambulatory Visit: Payer: Self-pay | Admitting: Neurology

## 2018-08-09 NOTE — Progress Notes (Signed)
NEUROLOGY FOLLOW UP OFFICE NOTE  [place] [id number]  HISTORY OF PRESENT ILLNESS: [place] right-handed woman with hypertension and depression who follows up for essential tremor.  UPDATE:  Patient has not been seen since September 2018.  Current medication:  Primidone 200mg  at bedtime Tremors were improved for a while but have increased.  She notices that it is affecting her writing.  She does not note any difficulty holding utensils.  She was recently taken off of some blood pressure medications due to low blood pressure.  She still takes HCTZ for leg swelling.  Blood pressure in in the 90s/70s (yesterday it was 116/70).    HISTORY: She has had tremor in both hands for less than a year.  She notices her hand shakes when she is holding on object, such as handing a plate to somebody.  She also notices it sometimes when she writes.  She doesn't notice it when she is eating with utensils.  It is worse when she is made aware of it.  It improves after she drinks a beer.  She denies difficulty with movement or initiating walking after she stands.  Her brother has tremor, but was told it was drug-induced.  She has reported short-term memory problems in that she may repeat questions or not remember content of conversations.  Recall is more of a problem.  She has trouble recalling names but not friends or family.  She has no difficulty paying her bills, remembering to take her medication or driving.  She denies family history of dementia.  She did have some college.  B12 level from 02/23/17 was 343.  She underwent neuropsychological testing on 04/26/17, which revealed no cognitive disorder.   PAST MEDICAL HISTORY: Past Medical History:  Diagnosis Date  . Arthritis   . Depression   . Hypertension     MEDICATIONS: Current Outpatient Medications on File Prior to Visit  Medication Sig Dispense Refill  . acetaminophen (TYLENOL) 500 MG tablet Take 1,000 mg by mouth every 6 (six)  hours as needed for mild pain, moderate pain, fever or headache.    . calcium carbonate (OS-CAL) 600 MG tablet Take 600 mg by mouth daily.    . Cholecalciferol (VITAMIN D) 2000 units tablet Take 2,000 Units by mouth daily.    Marland Kitchen esomeprazole (NEXIUM) 40 MG capsule Take 40 mg by mouth daily.    Marland Kitchen FLUoxetine (PROZAC) 40 MG capsule Take 40 mg by mouth daily.    Marland Kitchen lisinopril-hydrochlorothiazide (PRINZIDE,ZESTORETIC) 20-12.5 MG tablet Take 1 tablet by mouth daily.    . pantoprazole (PROTONIX) 40 MG tablet Take 1 tablet (40 mg total) by mouth 2 (two) times daily. 60 tablet 0  . primidone (MYSOLINE) 50 MG tablet Take 2 tablets (100 mg total) by mouth at bedtime. 60 tablet 2  . primidone (MYSOLINE) 50 MG tablet Take 4 tablets (200 mg total) by mouth at bedtime. 120 tablet 3  . primidone (MYSOLINE) 50 MG tablet TAKE 3 TABLETS BY MOUTH AT BEDTIME 90 tablet 5   No current facility-administered medications on file prior to visit.     ALLERGIES: Allergies  Allergen Reactions  . Nsaids Other (See Comments)    GI bleed    FAMILY HISTORY: No family history on file.  SOCIAL HISTORY: Social History   Socioeconomic History  . Marital status: Married    Spouse name: Not on file  . Number of children: Not on file  . Years of education: Not on file  . Highest education level:  Not on file  Occupational History  . Not on file  Social Needs  . Financial resource strain: Not on file  . Food insecurity:    Worry: Not on file    Inability: Not on file  . Transportation needs:    Medical: Not on file    Non-medical: Not on file  Tobacco Use  . Smoking status: Former Smoker    Types: Cigarettes    Last attempt to quit: 10/05/1978    Years since quitting: 39.8  . Smokeless tobacco: Never Used  Substance and Sexual Activity  . Alcohol use: Yes    Comment: 2 beers/week on average.  . Drug use: No  . Sexual activity: Never  Lifestyle  . Physical activity:    Days per week: Not on file    Minutes  per session: Not on file  . Stress: Not on file  Relationships  . Social connections:    Talks on phone: Not on file    Gets together: Not on file    Attends religious service: Not on file    Active member of club or organization: Not on file    Attends meetings of clubs or organizations: Not on file    Relationship status: Not on file  . Intimate partner violence:    Fear of current or ex partner: Not on file    Emotionally abused: Not on file    Physically abused: Not on file    Forced sexual activity: Not on file  Other Topics Concern  . Not on file  Social History Narrative  . Not on file    REVIEW OF SYSTEMS: Constitutional: No fevers, chills, or sweats, no generalized fatigue, change in appetite Eyes: No visual changes, double vision, eye pain Ear, nose and throat: No hearing loss, ear pain, nasal congestion, sore throat Cardiovascular: No chest pain, palpitations Respiratory:  No shortness of breath at rest or with exertion, wheezes GastrointestinaI: No nausea, vomiting, diarrhea, abdominal pain, fecal incontinence Genitourinary:  No dysuria, urinary retention or frequency Musculoskeletal:  No neck pain, back pain Integumentary: No rash, pruritus, skin lesions Neurological: as above Psychiatric: No depression, insomnia, anxiety Endocrine: No palpitations, fatigue, diaphoresis, mood swings, change in appetite, change in weight, increased thirst Hematologic/Lymphatic:  No purpura, petechiae. Allergic/Immunologic: no itchy/runny eyes, nasal congestion, recent allergic reactions, rashes  PHYSICAL EXAM: Blood pressure 96/76, pulse 70, height 5\' 4"  (1.626 m), weight 171 lb (77.6 kg), SpO2 95 %. General: No acute distress.  Patient appears well-groomed.   Head:  Normocephalic/atraumatic Eyes:  Fundi examined but not visualized Neck: supple, no paraspinal tenderness, full range of motion Heart:  Regular rate and rhythm Lungs:  Clear to auscultation bilaterally Back: No  paraspinal tenderness Neurological Exam: alert and oriented to person, place, and time. Attention span and concentration intact, recent and remote memory intact, fund of knowledge intact.  Speech fluent and not dysarthric, language intact.  CN II-XII intact. Bulk and tone normal, muscle strength 5/5 throughout.  No bradykinesia.  Sensation to light touch intact.  Deep tendon reflexes 2+ throughout, toes downgoing.  Finger to nose testing with postural and kinetic tremor in hands (right worse than left).  Gait normal, Romberg negative.  IMPRESSION: Benign essential tremor.  PLAN: 1.  We have plenty of room to increase primidone.  We will increase dose to 250mg  at bedtime and we can increase dose by 50mg  every month if needed until she follow up.  Due to low blood pressure, I defer adding propranolol  at this time. 2.  Follow up in 6 months.  19 minutes spent face to face with patient, over 50% spent discussing management.  Shon Millet, DO  CC: Donnetta Hutching, MD

## 2018-08-10 ENCOUNTER — Encounter: Payer: Self-pay | Admitting: Neurology

## 2018-08-10 ENCOUNTER — Ambulatory Visit (INDEPENDENT_AMBULATORY_CARE_PROVIDER_SITE_OTHER): Payer: Medicare Other | Admitting: Neurology

## 2018-08-10 VITALS — BP 96/76 | HR 70 | Ht 64.0 in | Wt 171.0 lb

## 2018-08-10 DIAGNOSIS — G250 Essential tremor: Secondary | ICD-10-CM | POA: Diagnosis not present

## 2018-08-10 MED ORDER — PRIMIDONE 250 MG PO TABS: 250.0000 mg | ORAL_TABLET | Freq: Every day | ORAL | 5 refills | Status: DC

## 2018-08-10 NOTE — Patient Instructions (Signed)
1.  We will increase primidone to 250mg  at bedtime.  If tremor not improved in 4 weeks, contact me and we will add 50mg  to the 250mg  tablet.  We can increase dose by 50mg  every month as needed. 2.  Follow up in 6 months.

## 2018-09-12 ENCOUNTER — Telehealth: Payer: Self-pay | Admitting: Neurology

## 2018-09-12 NOTE — Telephone Encounter (Signed)
Patient is still having the tremors on the increase of medication and would like to talk to someone

## 2018-09-13 MED ORDER — PRIMIDONE 50 MG PO TABS: 50.0000 mg | ORAL_TABLET | Freq: Every day | ORAL | 6 refills | Status: DC

## 2018-09-13 MED ORDER — PRIMIDONE 250 MG PO TABS: 250.0000 mg | ORAL_TABLET | Freq: Every day | ORAL | 6 refills | Status: DC

## 2018-09-13 NOTE — Telephone Encounter (Signed)
Called and spoke with Pt. Per Dr Rosanne SackJaffes last OV note, Pt may increase Primidone by 50 mg a month if needed, sending in

## 2018-11-04 ENCOUNTER — Telehealth: Payer: Self-pay | Admitting: Neurology

## 2018-11-04 NOTE — Telephone Encounter (Signed)
Called and advised Pt to add another 50 mg. She will call when she needs a refill.

## 2018-11-04 NOTE — Telephone Encounter (Signed)
Patient is calling in that the primidone 300mg  isn't working. Please call her back and advise. Thanks!

## 2018-11-07 ENCOUNTER — Telehealth: Payer: Self-pay | Admitting: Neurology

## 2018-11-07 DIAGNOSIS — G250 Essential tremor: Secondary | ICD-10-CM

## 2018-11-07 MED ORDER — PRIMIDONE 50 MG PO TABS: 100.0000 mg | ORAL_TABLET | Freq: Every day | ORAL | 3 refills | Status: DC

## 2018-11-07 MED ORDER — PRIMIDONE 250 MG PO TABS
250.0000 mg | ORAL_TABLET | Freq: Every day | ORAL | 3 refills | Status: DC
Start: 2018-11-07 — End: 2019-09-04

## 2018-11-07 NOTE — Telephone Encounter (Signed)
Patient called and is needing a refill on her Primidone medication. She uses [facility] in [place]. Her Dose had been increased and she has ran out quicker. Thanks

## 2019-01-31 ENCOUNTER — Telehealth (INDEPENDENT_AMBULATORY_CARE_PROVIDER_SITE_OTHER): Payer: Medicare Other | Admitting: Neurology

## 2019-01-31 ENCOUNTER — Other Ambulatory Visit: Payer: Self-pay

## 2019-01-31 ENCOUNTER — Encounter: Payer: Self-pay | Admitting: Neurology

## 2019-01-31 VITALS — BP 116/64 | Temp 97.6°F | Ht 64.0 in | Wt 160.0 lb

## 2019-01-31 DIAGNOSIS — G250 Essential tremor: Secondary | ICD-10-CM | POA: Diagnosis not present

## 2019-01-31 MED ORDER — PRIMIDONE 50 MG PO TABS: ORAL_TABLET | ORAL | 1 refills | Status: DC

## 2019-01-31 NOTE — Progress Notes (Signed)
Virtual Visit via Video Note The purpose of this virtual visit is to provide medical care while limiting exposure to the novel coronavirus.    Consent was obtained for video visit:  Yes.   Answered questions that patient had about telehealth interaction:  Yes.   I discussed the limitations, risks, security and privacy concerns of performing an evaluation and management service by telemedicine. I also discussed with the patient that there may be a patient responsible charge related to this service. The patient expressed understanding and agreed to proceed.  Pt location: Home Physician Location: office Name of referring provider:  [place], Carlena BjornstadJohn Cruz,* I connected with Lindsay Cruz at patients initiation/request on 01/31/2019 at 10:00 AM EDT by video enabled telemedicine application and verified that I am speaking with the correct person using two identifiers. Pt MRN:  [id number] Pt DOB:  [birth year] Video Participants:  Lindsay Cruz   History of Present Illness:  Lindsay Cruz is a right-handed woman with hypertension and depression who follows up for essential tremor.  UPDATE: Current medication:  Primidone 350mg  at bedtime Other medications:  Gabapentin, Prozac Due to increased tremor, primidone has steadily increased from 200mg  to 350mg  at bedtime.  However, she has not noticed an improvement in tremor.  Sometimes writing is difficult.  She denies difficulty eating or buttoning her shirts.  HISTORY: She has had tremor in both hands for less than a year. She notices her hand shakes when she is holding on object, such as handing a plate to somebody. She also notices it sometimes when she writes. She doesn't notice it when she is eating with utensils. It is worse when she is made aware of it. It improves after she drinks a beer. She denies difficulty with movement or initiating walking after she stands. Her brother has tremor, but was told it was drug-induced.  She  has reported short-term memory problems in that she may repeat questions or not remember content of conversations. Recall is more of a problem. She has trouble recalling names but not friends or family. She has no difficulty paying her bills, remembering to take her medication or driving. She denies family history of dementia. She did have some college.  B12 level from 02/23/17 was 343. She underwent neuropsychological testing on 04/26/17, which revealed no cognitive disorder.  Past Medical History: Past Medical History:  Diagnosis Date  . Arthritis   . Depression   . Hypertension     Medications: Outpatient Encounter Medications as of 01/31/2019  Medication Sig  . acetaminophen (TYLENOL) 500 MG tablet Take 1,000 mg by mouth every 6 (six) hours as needed for mild pain, moderate pain, fever or headache.  . calcium carbonate (OS-CAL) 600 MG tablet Take 600 mg by mouth daily.  . Cholecalciferol (VITAMIN D) 2000 units tablet Take 2,000 Units by mouth daily.  Marland Kitchen. esomeprazole (NEXIUM) 40 MG capsule Take 40 mg by mouth daily.  Marland Kitchen. FLUoxetine (PROZAC) 40 MG capsule Take 40 mg by mouth daily.  . hydrochlorothiazide (HYDRODIURIL) 12.5 MG tablet Take 12.5 mg by mouth daily.  . primidone (MYSOLINE) 250 MG tablet Take 1 tablet (250 mg total) by mouth at bedtime.  . primidone (MYSOLINE) 250 MG tablet Take 1 tablet (250 mg total) by mouth at bedtime.  . primidone (MYSOLINE) 250 MG tablet Take 1 tablet (250 mg total) by mouth at bedtime.  . primidone (MYSOLINE) 50 MG tablet Take 1 tablet (50 mg total) by mouth at bedtime.  . primidone (MYSOLINE) 50 MG tablet Take 2  tablets (100 mg total) by mouth at bedtime. In addition to the 250 mg tablet   No facility-administered encounter medications on file as of 01/31/2019.     Allergies: Allergies  Allergen Reactions  . Nsaids Other (See Comments)    GI bleed    Family History: No family history on file.  Social History: Social History   Socioeconomic  History  . Marital status: Married    Spouse name: Not on file  . Number of children: Not on file  . Years of education: Not on file  . Highest education level: Not on file  Occupational History  . Not on file  Social Needs  . Financial resource strain: Not on file  . Food insecurity:    Worry: Not on file    Inability: Not on file  . Transportation needs:    Medical: Not on file    Non-medical: Not on file  Tobacco Use  . Smoking status: Former Smoker    Types: Cigarettes    Last attempt to quit: 10/05/1978    Years since quitting: 40.3  . Smokeless tobacco: Never Used  Substance and Sexual Activity  . Alcohol use: Yes    Comment: 2 beers/week on average.  . Drug use: No  . Sexual activity: Never  Lifestyle  . Physical activity:    Days per week: Not on file    Minutes per session: Not on file  . Stress: Not on file  Relationships  . Social connections:    Talks on phone: Not on file    Gets together: Not on file    Attends religious service: Not on file    Active member of club or organization: Not on file    Attends meetings of clubs or organizations: Not on file    Relationship status: Not on file  . Intimate partner violence:    Fear of current or ex partner: Not on file    Emotionally abused: Not on file    Physically abused: Not on file    Forced sexual activity: Not on file  Other Topics Concern  . Not on file  Social History Narrative  . Not on file   Observations/Objective:   Blood pressure 116/64, temperature 97.6 F (36.4 C), height  (1.626 m), weight 160 lb (72.6 kg). Alert and oriented.  Speech fluent and not dysarthric.  Language intact.  Eyes orthophoric and move in all directions.  Face symmetric.  No pronator drift.  Mild postural and kinetic tremor on finger to nose testing.  Camera noted to be shaking when she holds her phone out.  Assessment and Plan:   Essential tremor.  Her PCP discontinued one of her blood pressure medications due to  low blood pressure.  Therefore, I do not want to start propranolol at this time.  1.  We will increase primidone to  in morning and  at night (she will take two  tablet in morning and one  with one  tablet at night) 2.  In 2 weeks we can increase dose to  in morning and  at night. 3.  Follow up in 5 months.  Follow Up Instructions:    -I discussed the assessment and treatment plan with the patient. The patient was provided an opportunity to ask questions and all were answered. The patient agreed with the plan and demonstrated an understanding of the instructions.   The patient was advised to call back or seek an in-person evaluation if the symptoms  worsen or if the condition fails to improve as anticipated.    Total Time spent in visit with the patient was:  12 minutes.   Cira Servant, DO

## 2019-01-31 NOTE — Patient Instructions (Signed)
Increase primidone to 100mg  in morning and 300mg  at night. Follow up in 5 months

## 2019-02-08 ENCOUNTER — Ambulatory Visit: Payer: Medicare Other | Admitting: Neurology

## 2019-04-03 ENCOUNTER — Telehealth: Payer: Self-pay | Admitting: Neurology

## 2019-04-03 MED ORDER — PRIMIDONE 50 MG PO TABS: 50.0000 mg | ORAL_TABLET | ORAL | 5 refills | Status: DC

## 2019-04-03 NOTE — Telephone Encounter (Signed)
Called and spoke with Pt. Reminded her she may increase the morning dose to 150 mg (three 50 mg tablets).

## 2019-04-03 NOTE — Telephone Encounter (Signed)
Patient is calling in again. Thanks!  °

## 2019-04-03 NOTE — Telephone Encounter (Signed)
Patient left msg with after hours about primidone medication ad that her tremors are not getting any better. Wants to increase the dosage. Thanks!

## 2019-05-02 ENCOUNTER — Telehealth: Payer: Self-pay | Admitting: Neurology

## 2019-05-02 MED ORDER — PROPRANOLOL HCL ER 60 MG PO CP24: 60.0000 mg | ORAL_CAPSULE | Freq: Every day | ORAL | 5 refills | Status: DC

## 2019-05-02 NOTE — Telephone Encounter (Signed)
I would like to start propranolol ER 60mg  daily.  She should monitor for lightheadedness as it is typically used for high blood pressure or elevated heart rate, but also very effective for tremor.  She should remain on primidone 150mg  in AM and 300mg  at bedtime.

## 2019-05-02 NOTE — Telephone Encounter (Signed)
Called patient no answer left this message on voice mail and for her to call office back to confirm the increase dose

## 2019-05-02 NOTE — Telephone Encounter (Signed)
Called spoke with patient she is aware and agrees Rx sent Pharmacy verified

## 2019-05-02 NOTE — Telephone Encounter (Signed)
Increase morning dose of primidone to 150mg  and continue 300mg  at night.

## 2019-05-02 NOTE — Telephone Encounter (Signed)
   Pt called back Pt is currently taken 150 mg AM in 300 mg at bedtime  this is her current dose

## 2019-05-02 NOTE — Telephone Encounter (Signed)
Patient states the tremors are bad and would like to speak to someone please call

## 2019-05-02 NOTE — Telephone Encounter (Signed)
  Pt c/o increasing tremor Current tremor meds AND times they are taken  Primidone  two 50mg  tablet in morning and one 50mg  with one 250mg  tablet at night)  Pts next appointment: 07/03/2019  Notice over the past week tremor are getting worst increase dose is not helping

## 2019-05-26 ENCOUNTER — Other Ambulatory Visit: Payer: Self-pay

## 2019-05-26 MED ORDER — PRIMIDONE 50 MG PO TABS: 50.0000 mg | ORAL_TABLET | ORAL | 3 refills | Status: DC

## 2019-06-30 ENCOUNTER — Encounter: Payer: Self-pay | Admitting: Neurology

## 2019-07-02 NOTE — Progress Notes (Signed)
Virtual Visit via Video Note The purpose of this virtual visit is to provide medical care while limiting exposure to the novel coronavirus.    Consent was obtained for video visit:  Yes Answered questions that patient had about telehealth interaction:  Yes I discussed the limitations, risks, security and privacy concerns of performing an evaluation and management service by telemedicine. I also discussed with the patient that there may be a patient responsible charge related to this service. The patient expressed understanding and agreed to proceed.  Pt location: Home Physician Location: office Name of referring provider:  Hungarland, John David,* I connected with Lindsay RalphVirginia Cruz at patients initiation/request onCarlena Bjornstad 07/03/2019 at 10:10 AM EDT by video enabled telemedicine application and verified that I am speaking with the correct person using two identifiers. Pt MRN:  [id number] Pt DOB:  August 05, 1951 Video Participants:  Lindsay RalphVirginia Cruz   History of Present Illness:  Lindsay RalphVirginia Cruz is a 68 year-old right-handed woman with hypertension and depression who follows up for essential tremor.  UPDATE: Since last visit, primidone was increased and she was also started on propranolol.   Current medication: Primidone 150mg  in AM and 300mg  at bedtime, propranolol ER 60mg  daily Other medications:  Gabapentin, Prozac  She hasn't really noticed much difference adding the propranolol ER 60mg  daily.  Sometimes she says she feels dizzy or lightheaded.    HISTORY: She has had tremor in both hands for less than a year. She notices her hand shakes when she is holding on object, such as handing a plate to somebody. She also notices it sometimes when she writes. She doesn't notice it when she is eating with utensils. It is worse when she is made aware of it. It improves after she drinks a beer. She denies difficulty with movement or initiating walking after she stands. Her brother has tremor, but  was told it was drug-induced.  She hasreportedshort-term memory problems in that she may repeat questions or not remember content of conversations. Recall is more of a problem. She has trouble recalling names but not friends or family. She has no difficulty paying her bills, remembering to take her medication or driving. She denies family history of dementia. She did have some college. B12 level from 02/23/17 was 343. She underwent neuropsychological testing on 04/26/17, which revealed no cognitive disorder.  Past Medical History: Past Medical History:  Diagnosis Date  . Arthritis   . Depression   . Hypertension     Medications: Outpatient Encounter Medications as of 07/03/2019  Medication Sig  . acetaminophen (TYLENOL) 500 MG tablet Take 1,000 mg by mouth every 6 (six) hours as needed for mild pain, moderate pain, fever or headache.  . calcium carbonate (OS-CAL) 600 MG tablet Take 600 mg by mouth daily.  . Cholecalciferol (VITAMIN D) 2000 units tablet Take 2,000 Units by mouth daily.  Marland Kitchen. esomeprazole (NEXIUM) 40 MG capsule Take 40 mg by mouth daily.  Marland Kitchen. FLUoxetine (PROZAC) 40 MG capsule Take 40 mg by mouth daily.  Marland Kitchen. gabapentin (NEURONTIN) 300 MG capsule Take 300 mg by mouth 2 (two) times daily.  . hydrochlorothiazide (HYDRODIURIL) 12.5 MG tablet Take 12.5 mg by mouth daily.  . primidone (MYSOLINE) 250 MG tablet Take 1 tablet (250 mg total) by mouth at bedtime.  . primidone (MYSOLINE) 50 MG tablet Take 2 tablets in morning and 1 tablet at bedtime (with 250mg  tablet at bedtime)  . primidone (MYSOLINE) 50 MG tablet Take 1 tablet (50 mg total) by mouth as directed. 3 tablets  in the AM; 1 tablet QHS with the 250 mg tablet.  . propranolol ER (INDERAL LA) 60 MG 24 hr capsule Take 1 capsule (60 mg total) by mouth daily.   No facility-administered encounter medications on file as of 07/03/2019.     Allergies: Allergies  Allergen Reactions  . Nsaids Other (See Comments)    GI bleed     Family History: History reviewed. No pertinent family history.  Social History: Social History   Socioeconomic History  . Marital status: Married    Spouse name: Iona Beard  . Number of children: Not on file  . Years of education: Not on file  . Highest education level: Not on file  Occupational History  . Not on file  Social Needs  . Financial resource strain: Not on file  . Food insecurity    Worry: Not on file    Inability: Not on file  . Transportation needs    Medical: Not on file    Non-medical: Not on file  Tobacco Use  . Smoking status: Former Smoker    Types: Cigarettes    Quit date: 10/05/1978    Years since quitting: 40.7  . Smokeless tobacco: Never Used  Substance and Sexual Activity  . Alcohol use: Yes    Comment: 2 beers/week on average.  . Drug use: No  . Sexual activity: Never  Lifestyle  . Physical activity    Days per week: Not on file    Minutes per session: Not on file  . Stress: Not on file  Relationships  . Social Herbalist on phone: Not on file    Gets together: Not on file    Attends religious service: Not on file    Active member of club or organization: Not on file    Attends meetings of clubs or organizations: Not on file    Relationship status: Not on file  . Intimate partner violence    Fear of current or ex partner: Not on file    Emotionally abused: Not on file    Physically abused: Not on file    Forced sexual activity: Not on file  Other Topics Concern  . Not on file  Social History Narrative   One story home   2 adult children   Drinks caffeine, one cup per day   Right handed    Observations/Objective:   Height 5\' 4"  (1.626 m), weight 155 lb (70.3 kg). No acute distress.  Alert and oriented.  Speech fluent and not dysarthric.  Language intact.  Eyes orthophoric on primary gaze.  Face symmetric.  With postural and kinetic tremor in hands  Assessment and Plan:   Essential tremor  1.  Increase propranolol ER to  80mg  daily.  If she experiences worsening dizziness/lightheadedness, she will contact me.   2.  Continue primidone 150mg  in AM and 300mg  at night. 3.  Follow up in 3 months.  If she experiences lightheadedness or no improvement in 6 weeks, we will stop propranolol and increase primidone to 300mg  twice daily.  When she returns in 3 months, if tremor not improved, will start topiramate.  Follow Up Instructions:    -I discussed the assessment and treatment plan with the patient. The patient was provided an opportunity to ask questions and all were answered. The patient agreed with the plan and demonstrated an understanding of the instructions.   The patient was advised to call back or seek an in-person evaluation if the symptoms worsen or  if the condition fails to improve as anticipated.    Total Time spent in visit with the patient was:  11 minutes  Cira Servant, DO

## 2019-07-03 ENCOUNTER — Telehealth (INDEPENDENT_AMBULATORY_CARE_PROVIDER_SITE_OTHER): Payer: Medicare Other | Admitting: Neurology

## 2019-07-03 ENCOUNTER — Encounter: Payer: Self-pay | Admitting: Neurology

## 2019-07-03 ENCOUNTER — Other Ambulatory Visit: Payer: Self-pay

## 2019-07-03 VITALS — Ht 64.0 in | Wt 155.0 lb

## 2019-07-03 DIAGNOSIS — G250 Essential tremor: Secondary | ICD-10-CM

## 2019-07-03 MED ORDER — PROPRANOLOL HCL ER 80 MG PO CP24
80.0000 mg | ORAL_CAPSULE | Freq: Every day | ORAL | 2 refills | Status: DC
Start: 2019-07-03 — End: 2019-09-04

## 2019-07-03 NOTE — Patient Instructions (Signed)
1.  I will increase propranolol ER to 80mg  daily.  Contact me in 6 weeks with update or sooner if you experience dizziness/lightheadedness 2.  Continue primidone 150mg  in AM and 300mg  at night 3.  Follow up in 3 months.

## 2019-09-04 ENCOUNTER — Telehealth: Payer: Self-pay | Admitting: Neurology

## 2019-09-04 DIAGNOSIS — G250 Essential tremor: Secondary | ICD-10-CM

## 2019-09-04 MED ORDER — PRIMIDONE 250 MG PO TABS: ORAL_TABLET | ORAL | 3 refills | Status: DC

## 2019-09-04 MED ORDER — PRIMIDONE 50 MG PO TABS: ORAL_TABLET | ORAL | 1 refills | Status: DC

## 2019-09-04 NOTE — Telephone Encounter (Signed)
Per last office visit on 07/03/19 Assessment and Plan:   Essential tremor  1.  Increase propranolol ER to 80mg  daily.  If she experiences worsening dizziness/lightheadedness, she will contact me.   2.  Continue primidone 150mg  in AM and 300mg  at night. 3.  Follow up in 3 months.  If she experiences lightheadedness or no improvement in 6 weeks, we will stop propranolol and increase primidone to 300mg  twice daily.  When she returns in 3 months, if tremor not improved, will start topiramate.  Called spoke with patient she was informed of this and will increase primidone to 300 mg BID New Rx sent to pharmacy Pt agrees to changes and will start new dose

## 2019-09-04 NOTE — Telephone Encounter (Signed)
Patient called and left a message with Access Nurse at 12:37 PM today. She said she has been taking a new new medication, propanolol ER 80 MG for 6 weeks now and it does not seem to be helping with her tremors. She's like to speak with  Dr. Tomi Likens or his nurse about this.  [employer] in [place], [place]

## 2019-10-13 ENCOUNTER — Encounter: Payer: Self-pay | Admitting: Neurology

## 2019-10-16 NOTE — Progress Notes (Signed)
Virtual Visit via Video Note The purpose of this virtual visit is to provide medical care while limiting exposure to the novel coronavirus.    Consent was obtained for video visit:  Yes.   Answered questions that patient had about telehealth interaction:  Yes.   I discussed the limitations, risks, security and privacy concerns of performing an evaluation and management service by telemedicine. I also discussed with the patient that there may be a patient responsible charge related to this service. The patient expressed understanding and agreed to proceed.  Pt location: Home Physician Location: office Name of referring provider:  Hungarland, Jenetta Downer I connected with Lindsay Cruz at patients initiation/request on 10/17/2019 at 10:30 AM EST by video enabled telemedicine application and verified that I am speaking with the correct person using two identifiers. Pt MRN:  [id number] Pt DOB:  Mar 02, 1951 Video Participants:  Lindsay Cruz   History of Present Illness:  Lindsay Cruz is a 69 year-old right-handedwoman with hypertension and depression who follows up for essential tremor.  UPDATE: Since last visit, primidone has been increased to 300mg  twice daily.  Propranolol was subsequently stopped.  Current medication: Primidone300mg  twice daily, propranolol ER 80mg  daily, Prozac  Since increasing the primidone, she doesn't feel as steady on her feet.  Tremor isn't significantly improved.  She states that the tremor doesn't really bother her.  It tends to be more concerning to her family.  HISTORY: She has had tremor in both hands for less than a year. She notices her hand shakes when she is holding on object, such as handing a plate to somebody. She also notices it sometimes when she writes. She doesn't notice it when she is eating with utensils. It is worse when she is made aware of it. It improves after she drinks a beer. She denies difficulty with movement or  initiating walking after she stands. Her brother has tremor, but was told it was drug-induced.  She hasreportedshort-term memory problems in that she may repeat questions or not remember content of conversations. Recall is more of a problem. She has trouble recalling names but not friends or family. She has no difficulty paying her bills, remembering to take her medication or driving. She denies family history of dementia. She did have some college. B12 level from 02/23/17 was 343. She underwent neuropsychological testing on 04/26/17, which revealed no cognitive disorder.  Past medications:  Propranolol ER 80mg  daily; gabapentin 300mg  twice daily  Past Medical History: Past Medical History:  Diagnosis Date  . Arthritis   . Depression   . Hypertension     Medications: Outpatient Encounter Medications as of 10/17/2019  Medication Sig  . acetaminophen (TYLENOL) 500 MG tablet Take 1,000 mg by mouth every 6 (six) hours as needed for mild pain, moderate pain, fever or headache.  . calcium carbonate (OS-CAL) 600 MG tablet Take 600 mg by mouth daily.  . Cholecalciferol (VITAMIN D) 2000 units tablet Take 2,000 Units by mouth daily.  Marland Kitchen esomeprazole (NEXIUM) 40 MG capsule Take 40 mg by mouth daily.  Marland Kitchen FLUoxetine (PROZAC) 40 MG capsule Take 40 mg by mouth daily.  . hydrochlorothiazide (HYDRODIURIL) 12.5 MG tablet Take 12.5 mg by mouth daily.  . primidone (MYSOLINE) 250 MG tablet Take 1 tablet in AM and 1 tablet in PM (along with 50MG  in AM/PM)  . primidone (MYSOLINE) 50 MG tablet Take 1 tablets in morning and 1 tablet at bedtime (with 250mg  tablet at bedtime)  . gabapentin (NEURONTIN) 300 MG capsule Take  300 mg by mouth 2 (two) times daily.   No facility-administered encounter medications on file as of 10/17/2019.    Allergies: Allergies  Allergen Reactions  . Nsaids Other (See Comments)    GI bleed    Family History: History reviewed. No pertinent family history.  Social  History: Social History   Socioeconomic History  . Marital status: Married    Spouse name: Greggory Stallion  . Number of children: Not on file  . Years of education: Not on file  . Highest education level: Not on file  Occupational History  . Not on file  Tobacco Use  . Smoking status: Former Smoker    Types: Cigarettes    Quit date: 10/05/1978    Years since quitting: 41.0  . Smokeless tobacco: Never Used  Substance and Sexual Activity  . Alcohol use: Yes    Comment: 2 beers/week on average.  . Drug use: No  . Sexual activity: Never  Other Topics Concern  . Not on file  Social History Narrative   One story home   2 adult children   Drinks caffeine, one cup per day   Right handed   Social Determinants of Health   Financial Resource Strain:   . Difficulty of Paying Living Expenses: Not on file  Food Insecurity:   . Worried About Programme researcher, broadcasting/film/video in the Last Year: Not on file  . Ran Out of Food in the Last Year: Not on file  Transportation Needs:   . Lack of Transportation (Medical): Not on file  . Lack of Transportation (Non-Medical): Not on file  Physical Activity:   . Days of Exercise per Week: Not on file  . Minutes of Exercise per Session: Not on file  Stress:   . Feeling of Stress : Not on file  Social Connections:   . Frequency of Communication with Friends and Family: Not on file  . Frequency of Social Gatherings with Friends and Family: Not on file  . Attends Religious Services: Not on file  . Active Member of Clubs or Organizations: Not on file  . Attends Banker Meetings: Not on file  . Marital Status: Not on file  Intimate Partner Violence:   . Fear of Current or Ex-Partner: Not on file  . Emotionally Abused: Not on file  . Physically Abused: Not on file  . Sexually Abused: Not on file    Observations/Objective:   Height 5\' 4"  (1.626 m), weight 155 lb (70.3 kg). No acute distress.  Alert and oriented.  Speech fluent and not dysarthric.   Language intact.  Assessment and Plan:   Essential tremor  Due to side effects, we will decrease primidone back down to 150mg  in AM and 300mg  at night (refilled).  Since the tremor does not bother her, I favor not being aggressive in treating it at this time, as side effects of these medications may outweigh benefits.  If her tremor requires further management, then I would consider starting zonisamide (I would defer topiramate due to potential cognitive deficits; zonisamide tends to be better tolerated).  She will follow up in 5 months.  Follow Up Instructions:    -I discussed the assessment and treatment plan with the patient. The patient was provided an opportunity to ask questions and all were answered. The patient agreed with the plan and demonstrated an understanding of the instructions.   The patient was advised to call back or seek an in-person evaluation if the symptoms worsen or if the  condition fails to improve as anticipated.   Cira Servant, DO

## 2019-10-17 ENCOUNTER — Other Ambulatory Visit: Payer: Self-pay

## 2019-10-17 ENCOUNTER — Encounter: Payer: Self-pay | Admitting: Neurology

## 2019-10-17 ENCOUNTER — Telehealth (INDEPENDENT_AMBULATORY_CARE_PROVIDER_SITE_OTHER): Payer: Medicare Other | Admitting: Neurology

## 2019-10-17 VITALS — Ht 64.0 in | Wt 155.0 lb

## 2019-10-17 DIAGNOSIS — G250 Essential tremor: Secondary | ICD-10-CM | POA: Diagnosis not present

## 2019-10-17 MED ORDER — PRIMIDONE 250 MG PO TABS: 250.0000 mg | ORAL_TABLET | Freq: Every day | ORAL | 5 refills | Status: DC

## 2019-10-17 MED ORDER — PRIMIDONE 50 MG PO TABS: ORAL_TABLET | ORAL | 5 refills | Status: DC

## 2020-03-14 NOTE — Progress Notes (Deleted)
NEUROLOGY FOLLOW UP OFFICE NOTE  [place] [id number]  HISTORY OF PRESENT ILLNESS: Lindsay Cruz is a69 year-oldright-handedwoman with hypertension and depression who follows up for essential tremor.  UPDATE: Due to side effects (unsteadiness on her feet), morning dose of primidone was reduced.  Current medication: Primidone 150mg  in AM and300mg  at night  ***  HISTORY: She has had tremor in both hands for less than a year. She notices her hand shakes when she is holding on object, such as handing a plate to somebody. She also notices it sometimes when she writes. She doesn't notice it when she is eating with utensils. It is worse when she is made aware of it. It improves after she drinks a beer. She denies difficulty with movement or initiating walking after she stands. Her brother has tremor, but was told it was drug-induced.  She hasreportedshort-term memory problems in that she may repeat questions or not remember content of conversations. Recall is more of a problem. She has trouble recalling names but not friends or family. She has no difficulty paying her bills, remembering to take her medication or driving. She denies family history of dementia. She did have some college. B12 level from 02/23/17 was 343. She underwent neuropsychological testing on 04/26/17, which revealed no cognitive disorder.  Past medications:  Propranolol ER 80mg  daily (lightheadedness); gabapentin 300mg  twice daily  PAST MEDICAL HISTORY: Past Medical History:  Diagnosis Date  . Arthritis   . Depression   . Hypertension     MEDICATIONS: Current Outpatient Medications on File Prior to Visit  Medication Sig Dispense Refill  . acetaminophen (TYLENOL) 500 MG tablet Take 1,000 mg by mouth every 6 (six) hours as needed for mild pain, moderate pain, fever or headache.    . calcium carbonate (OS-CAL) 600 MG tablet Take 600 mg by mouth daily.    . Cholecalciferol (VITAMIN D)  2000 units tablet Take 2,000 Units by mouth daily.    04/28/17 esomeprazole (NEXIUM) 40 MG capsule Take 40 mg by mouth daily.    FLUoxetine (PROZAC) 40 MG capsule Take 40 mg by mouth daily.    gabapentin (NEURONTIN) 300 MG capsule Take 300 mg by mouth 2 (two) times daily.    . hydrochlorothiazide (HYDRODIURIL) 12.5 MG tablet Take 12.5 mg by mouth daily.  1  . primidone (MYSOLINE) 250 MG tablet Take 1 tablet (250 mg total) by mouth at bedtime. Take with one 50mg  tablet 30 tablet 5  . primidone (MYSOLINE) 50 MG tablet Take 3 tablets in morning and 1 tablet at night. 120 tablet 5   No current facility-administered medications on file prior to visit.    ALLERGIES: Allergies  Allergen Reactions  . Nsaids Other (See Comments)    GI bleed    FAMILY HISTORY: No family history on file.  SOCIAL HISTORY: Social History   Socioeconomic History  . Marital status: Married    Spouse name: Marland Kitchen  . Number of children: Not on file  . Years of education: Not on file  . Highest education level: Not on file  Occupational History  . Not on file  Tobacco Use  . Smoking status: Former Smoker    Types: Cigarettes    Quit date: 10/05/1978    Years since quitting: 41.4  . Smokeless tobacco: Never Used  Vaping Use  . Vaping Use: Never used  Substance and Sexual Activity  . Alcohol use: Yes    Comment: 2 beers/week on average.  . Drug use: No  .  Sexual activity: Never  Other Topics Concern  . Not on file  Social History Narrative   One story home   2 adult children   Drinks caffeine, one cup per day   Right handed   Social Determinants of Health   Financial Resource Strain:   . Difficulty of Paying Living Expenses:   Food Insecurity:   . Worried About Charity fundraiser in the Last Year:   . Arboriculturist in the Last Year:   Transportation Needs:   . Film/video editor (Medical):   Marland Kitchen Lack of Transportation (Non-Medical):   Physical Activity:   . Days of Exercise per Week:   .  Minutes of Exercise per Session:   Stress:   . Feeling of Stress :   Social Connections:   . Frequency of Communication with Friends and Family:   . Frequency of Social Gatherings with Friends and Family:   . Attends Religious Services:   . Active Member of Clubs or Organizations:   . Attends Archivist Meetings:   Marland Kitchen Marital Status:   Intimate Partner Violence:   . Fear of Current or Ex-Partner:   . Emotionally Abused:   Marland Kitchen Physically Abused:   . Sexually Abused:     PHYSICAL EXAM: *** General: No acute distress.  Patient appears well-groomed.   Head:  Normocephalic/atraumatic Eyes:  Fundi examined but not visualized Neck: supple, no paraspinal tenderness, full range of motion Heart:  Regular rate and rhythm Lungs:  Clear to auscultation bilaterally Back: No paraspinal tenderness Neurological Exam: alert and oriented to person, place, and time. Attention span and concentration intact, recent and remote memory intact, fund of knowledge intact.  Speech fluent and not dysarthric, language intact.  CN II-XII intact. Bulk and tone normal, muscle strength 5/5 throughout.  Sensation to light touch, temperature and vibration intact.  Deep tendon reflexes 2+ throughout, toes downgoing.  Finger to nose and heel to shin testing intact.  Gait normal, Romberg negative.  IMPRESSION: Benign essential tremor  PLAN: ***  Metta Clines, DO  CC: Yvone Neu, MD

## 2020-03-18 ENCOUNTER — Ambulatory Visit: Payer: Medicare Other | Admitting: Neurology

## 2020-04-20 ENCOUNTER — Other Ambulatory Visit: Payer: Self-pay | Admitting: Neurology

## 2020-05-31 ENCOUNTER — Other Ambulatory Visit: Payer: Self-pay | Admitting: Neurology

## 2020-06-06 ENCOUNTER — Ambulatory Visit: Payer: Medicare Other | Admitting: Physician Assistant

## 2020-06-21 ENCOUNTER — Telehealth: Payer: Self-pay

## 2020-06-21 NOTE — Telephone Encounter (Signed)
Patient is scheduled for 09/30/20

## 2020-06-21 NOTE — Telephone Encounter (Signed)
Is this referring to the refill of primidone?  A prescription was sent on 8/27 with pharmacy note for no further refills until patient makes a follow up appointment.

## 2020-06-21 NOTE — Telephone Encounter (Signed)
Tried call pt to let her know she will need to schedule a visit to get further refills. No answer unable to LVM.

## 2020-07-08 ENCOUNTER — Other Ambulatory Visit: Payer: Self-pay | Admitting: Neurology

## 2020-08-05 ENCOUNTER — Other Ambulatory Visit: Payer: Self-pay | Admitting: Neurology

## 2020-09-03 ENCOUNTER — Other Ambulatory Visit: Payer: Self-pay | Admitting: Neurology

## 2020-09-04 ENCOUNTER — Other Ambulatory Visit: Payer: Self-pay

## 2020-09-04 ENCOUNTER — Ambulatory Visit (INDEPENDENT_AMBULATORY_CARE_PROVIDER_SITE_OTHER): Payer: Medicare Other | Admitting: Dermatology

## 2020-09-04 DIAGNOSIS — L720 Epidermal cyst: Secondary | ICD-10-CM | POA: Diagnosis not present

## 2020-09-04 NOTE — Patient Instructions (Addendum)
Phone call Monday to let us know you are better. Per Dr.Tafeen wait at least a month after the inflammation goes down to schedule removal if that's what you want to do.  Shave Excision Benign Lesion Wound Care Instructions  . Leave the original bandage on for 24 hours if possible.  If the bandage becomes soaked or soiled before that time, it is OK to remove it and examine the wound.  A small amount of post-operative bleeding is normal.  If excessive bleeding occurs, remove the bandage, place gauze over the site and apply continuous pressure (no peeking) over the area for 20-30 minutes.  If this does not stop the bleeding, try again for 40 minutes.  If this does not work, please call our clinic as soon as possible (even if after-hours).    . Twice a day, cleanse the wound with soap and water.  If a thick crust develops you may use a Q-tip dipped into dilute hydrogen peroxide (mix 1:1 with water) to dissolve it.  Hydrogen peroxide can slow the healing process, so use it only as needed.  After washing, apply Vaseline jelly or Polysporin ointment.  For best healing, the wound should be covered with a layer of ointment at all times.  This may mean re-applying the ointment several times a day.  For open wounds, continue until it has healed.    . If you have any swelling, keep the area elevated.  . Some redness, tenderness and white or yellow material in the wound is normal healing.  If the area becomes very sore and red, or develops a thick yellow-green material (pus), it may be infected; please notify us.    . Wound healing continues for up to one year following surgery.  It is not unusual to experience pain in the scar from time to time during the interval.  If the pain becomes severe or the scar thickens, you should notify the office.  A slight amount of redness in a scar is expected for the first six months.  After six months, the redness subsides and the scar will soften and fade.  The color difference  becomes less noticeable with time.  If there are any problems, return for a post-op surgery check at your earliest convenience.  . Please call our office for any questions or concerns.

## 2020-09-08 ENCOUNTER — Encounter: Payer: Self-pay | Admitting: Dermatology

## 2020-09-08 NOTE — Progress Notes (Signed)
   Follow-Up Visit   Subjective  Lindsay Cruz is a 69 y.o. female who presents for the following: Skin Problem (Has a place on back of neck x years. Epidermoid cyst. Looks a little inflamed today and tender to touch. ).  Inflamed nodule Location: Back of the neck Duration: Present times years but tender times few weeks Quality:  Associated Signs/Symptoms: Modifying Factors:  Severity:  Timing: Context:   Objective  Well appearing patient in no apparent distress; mood and affect are within normal limits.  A focused examination was performed including Head and neck and upper back. Relevant physical exam findings are noted in the Assessment and Plan.   Assessment & Plan    Epidermoid cyst Neck - Posterior  I & D in office with #11 scalpel blade.  Copious keratinous debris with minimal cyst wall removed.. Patient is instructed to do warm compress and massage area to try and get more out of cyst. She will call Monday with an update on how she is doing.   Incision and Drainage - Neck - Posterior Location: right post neck  Informed Consent: Discussed risks (permanent scarring, light or dark discoloration, infection, pain, bleeding, bruising, redness, damage to adjacent structures, and recurrence of the lesion) and benefits of the procedure, as well as the alternatives.  Informed consent was obtained.  Preparation: The area was prepped with alcohol.  Anesthesia: Lidocaine 2% with epinephrine  Procedure Details: An incision was made overlying the lesion. The lesion drained pus, clear, mucoid fluid, blood.  Patient will do warm compress at home and call with update next week.   Antibiotic ointment and a sterile pressure dressing were applied. The patient tolerated procedure well.  Total number of lesions drained: 1  Plan: The patient was instructed on post-op care. Recommend OTC analgesia as needed for pain.      I, Janalyn Harder, MD, have reviewed all documentation for  this visit.  The documentation on 09/08/20 for the exam, diagnosis, procedures, and orders are all accurate and complete.

## 2020-09-09 ENCOUNTER — Telehealth: Payer: Self-pay | Admitting: Dermatology

## 2020-09-09 NOTE — Telephone Encounter (Signed)
Patient left message on office voice mail that she was calling with a progress report on last week's visit concerning the cyst that was drained.  Patient states that she is somewhat improved.  When she squeezes the area she continues to get slight pus, slight blood, and some very small white lumps.  Otherwise she is better.

## 2020-09-30 ENCOUNTER — Ambulatory Visit: Payer: Medicare Other | Admitting: Neurology

## 2020-09-30 NOTE — Progress Notes (Signed)
NEUROLOGY FOLLOW UP OFFICE NOTE  [place] [id number]   Subjective:  Lindsay Cruz is a69 year-oldright-handedwoman with hypertension and depression who follows up for essential tremor.  UPDATE: In January 2021, primidone was decreased back to 250mg  in AM and 300mg  at bedtime due to side effects (not feeling steady on her feet).  Current medication: Primidone150mg  in AM and 300mg  at night, Prozac  Tremor overall improved but still may be problematic sometimes such as when writing.  HISTORY: She has had tremor in both hands for less than a year. She notices her hand shakes when she is holding on object, such as handing a plate to somebody. She also notices it sometimes when she writes. She doesn't notice it when she is eating with utensils. It is worse when she is made aware of it. It improves after she drinks a beer. She denies difficulty with movement or initiating walking after she stands. Her brother has tremor, but was told it was drug-induced.  She hasreportedshort-term memory problems in that she may repeat questions or not remember content of conversations. Recall is more of a problem. She has trouble recalling names but not friends or family. She has no difficulty paying her bills, remembering to take her medication or driving. She denies family history of dementia. She did have some college. B12 level from 02/23/17 was 343. She underwent neuropsychological testing on 04/26/17, which revealed no cognitive disorder.  Past medications:  Propranolol ER 80mg  daily; gabapentin 300mg  twice daily  PAST MEDICAL HISTORY: Past Medical History:  Diagnosis Date  . Arthritis   . Depression   . Hypertension     MEDICATIONS: Current Outpatient Medications on File Prior to Visit  Medication Sig Dispense Refill  . acetaminophen (TYLENOL) 500 MG tablet Take 1,000 mg by mouth every 6 (six) hours as needed for mild pain, moderate pain, fever or headache.     . calcium carbonate (OS-CAL) 600 MG tablet Take 600 mg by mouth daily.    . Cholecalciferol (VITAMIN D) 2000 units tablet Take 2,000 Units by mouth daily.    esomeprazole (NEXIUM) 40 MG capsule Take 40 mg by mouth daily.    02/25/17 FLUoxetine (PROZAC) 40 MG capsule Take 40 mg by mouth daily.    04/28/17 gabapentin (NEURONTIN) 300 MG capsule Take 300 mg by mouth 2 (two) times daily. (Patient not taking: Reported on 09/04/2020)    . hydrochlorothiazide (HYDRODIURIL) 12.5 MG tablet Take 12.5 mg by mouth daily.  1  . HYDROcodone-acetaminophen (NORCO/VICODIN) 5-325 MG tablet Take 1 tablet by mouth every 6 (six) hours as needed. for pain (Patient not taking: Reported on 09/04/2020)    . primidone (MYSOLINE) 250 MG tablet TAKE 1 TABLET BY MOUTH ONCE DAILY AT BEDTIME ALONG  WITH  50  MG 30 tablet 0  . primidone (MYSOLINE) 50 MG tablet TAKE 3 TABLETS BY MOUTH ONCE DAILY IN THE MORNING AND THEN TAKE 1 TAB  EVERY DAY AT BEDTIME WITH  250MG   TAB 120 tablet 0   No current facility-administered medications on file prior to visit.    ALLERGIES: Allergies  Allergen Reactions  . Nsaids Other (See Comments)    GI bleed    FAMILY HISTORY: No family history on file.  SOCIAL HISTORY: Social History   Socioeconomic History  . Marital status: Married    Spouse name: Marland Kitchen  . Number of children: Not on file  . Years of education: Not on file  . Highest education level: Not on file  Occupational  History  . Not on file  Tobacco Use  . Smoking status: Former Smoker    Types: Cigarettes    Quit date: 10/05/1978    Years since quitting: 42.0  . Smokeless tobacco: Never Used  Vaping Use  . Vaping Use: Never used  Substance and Sexual Activity  . Alcohol use: Yes    Comment: 2 beers/week on average.  . Drug use: No  . Sexual activity: Never  Other Topics Concern  . Not on file  Social History Narrative   One story home   2 adult children   Drinks caffeine, one cup per day   Right handed   Social  Determinants of Health   Financial Resource Strain: Not on file  Food Insecurity: Not on file  Transportation Needs: Not on file  Physical Activity: Not on file  Stress: Not on file  Social Connections: Not on file  Intimate Partner Violence: Not on file     Objective:  Blood pressure 123/64, pulse 96, height 5\' 4"  (1.626 m), weight 160 lb 3.2 oz (72.7 kg), SpO2 (!) 75 %. General: No acute distress.  Patient appears well-groomed.   Head:  Normocephalic/atraumatic Eyes:  Fundi examined but not visualized Neck: supple, no paraspinal tenderness, full range of motion Heart:  Regular rate and rhythm Lungs:  Clear to auscultation bilaterally Back: No paraspinal tenderness Neurological Exam: alert and oriented to person, place, and time. Attention span and concentration intact, recent and remote memory intact, fund of knowledge intact.  Speech fluent and not dysarthric, language intact.  CN II-XII intact. Bulk and tone normal, muscle strength 5/5 throughout.  Fine postural and kinetic tremor.  Sensation to pinprick and vibration intact.  Deep tendon reflexes 1+ throughout, toes downgoing.  Finger to nose and heel to shin testing intact.  Mildly wide-based, Romberg negative.   Assessment/Plan:   Essential tremor  1.  Start topiramate 25mg  at bedtime.  We can increase to 50mg  at bedtime in 4 weeks if needed 2.  Continue primidone 150mg  in AM and 300mg  at night. 3.  She is having routine labs performed with her PCP on Thursday.  I asked to have those labs faxed to me. 4.  Follow up 6 months.  , DO  CC: , MD

## 2020-10-01 ENCOUNTER — Ambulatory Visit (INDEPENDENT_AMBULATORY_CARE_PROVIDER_SITE_OTHER): Payer: Medicare Other | Admitting: Neurology

## 2020-10-01 ENCOUNTER — Encounter: Payer: Self-pay | Admitting: Neurology

## 2020-10-01 ENCOUNTER — Other Ambulatory Visit: Payer: Self-pay

## 2020-10-01 VITALS — BP 123/64 | HR 96 | Ht 64.0 in | Wt 160.2 lb

## 2020-10-01 DIAGNOSIS — G250 Essential tremor: Secondary | ICD-10-CM | POA: Diagnosis not present

## 2020-10-01 MED ORDER — PRIMIDONE 50 MG PO TABS: ORAL_TABLET | ORAL | 1 refills | Status: DC

## 2020-10-01 MED ORDER — TOPIRAMATE 25 MG PO TABS: 25.0000 mg | ORAL_TABLET | Freq: Every day | ORAL | 5 refills | Status: DC

## 2020-10-01 MED ORDER — PRIMIDONE 250 MG PO TABS
ORAL_TABLET | ORAL | 1 refills | Status: DC
Start: 2020-10-01 — End: 2021-01-10

## 2020-10-01 NOTE — Patient Instructions (Signed)
1.  Start topiramate 25mg  at bedtime.  If no improvement in 4 weeks, contact me and we can increase dose 2.  Continue primidone 150mg  in morning and 300mg  at night 3.  Follow up 6 months.

## 2020-10-08 ENCOUNTER — Ambulatory Visit: Payer: Medicare Other | Admitting: Physician Assistant

## 2021-01-10 ENCOUNTER — Telehealth: Payer: Self-pay | Admitting: Neurology

## 2021-01-10 MED ORDER — PRIMIDONE 250 MG PO TABS: ORAL_TABLET | ORAL | 1 refills | Status: DC

## 2021-01-10 MED ORDER — PRIMIDONE 50 MG PO TABS: ORAL_TABLET | ORAL | 1 refills | Status: DC

## 2021-01-10 NOTE — Telephone Encounter (Signed)
Patient called in needing a refill of her Primidone sent to [facility] on [address] in [place]. She tried to take her previous one from [facility] to [facility] and they told her they needed a new one.

## 2021-03-24 ENCOUNTER — Ambulatory Visit: Payer: Medicare Other | Admitting: Neurology

## 2021-03-31 NOTE — Progress Notes (Signed)
NEUROLOGY FOLLOW UP OFFICE NOTE  [place] [id number]  Assessment/Plan:   Essential tremor   Will try increasing primidone to 200mg  in AM and 250mg  at bedtime to achieve better management. Continue topiramate 25mg  at bedtime.  Due to potential side effects, would try to avoid increasing dose.  Since it has been helpful, will continue current dose for now. Follow up 6 months.  Subjective:  Lindsay Cruz is a 70 year-old right-handed woman with hypertension and depression who follows up for essential tremor.   UPDATE: Current medication:  Topiramate 25mg  at bedtime; primidone 150mg  in AM and 300mg  at night (higher doses caused side effects), Prozac   Started topiramate in December.   She has good days and bad days.  They are worse when she feels more anxious.  She can't write as well but otherwise ADLs.  Overall notes improvement with topiramate.     HISTORY: She has had tremor in both hands for less than a year.  She notices her hand shakes when she is holding on object, such as handing a plate to somebody.  She also notices it sometimes when she writes.  She doesn't notice it when she is eating with utensils.  It is worse when she is made aware of it.  It improves after she drinks a beer.  She denies difficulty with movement or initiating walking after she stands.  Her brother has tremor, but was told it was drug-induced.   She has reported short-term memory problems in that she may repeat questions or not remember content of conversations.  Recall is more of a problem.  She has trouble recalling names but not friends or family.  She has no difficulty paying her bills, remembering to take her medication or driving.  She denies family history of dementia.  She did have some college.  B12 level from 02/23/17 was 343.  She underwent neuropsychological testing on 04/26/17, which revealed no cognitive disorder.  Her brother has tremor.     Past medications:  Propranolol ER 80mg   daily (side effects); gabapentin 300mg  twice daily  PAST MEDICAL HISTORY: Past Medical History:  Diagnosis Date   Arthritis    Depression    Hypertension     MEDICATIONS: Current Outpatient Medications on File Prior to Visit  Medication Sig Dispense Refill   acetaminophen (TYLENOL) 500 MG tablet Take 1,000 mg by mouth every 6 (six) hours as needed for mild pain, moderate pain, fever or headache.     calcium carbonate (OS-CAL) 600 MG tablet Take 600 mg by mouth daily.     Cholecalciferol (VITAMIN D) 2000 units tablet Take 2,000 Units by mouth daily.     denosumab (PROLIA) 60 MG/ML SOSY injection Inject 60 mg into the skin every 6 (six) months.     esomeprazole (NEXIUM) 40 MG capsule Take 40 mg by mouth daily.     FLUoxetine (PROZAC) 40 MG capsule Take 40 mg by mouth daily.     gabapentin (NEURONTIN) 300 MG capsule Take 300 mg by mouth 2 (two) times daily. (Patient not taking: No sig reported)     hydrochlorothiazide (HYDRODIURIL) 12.5 MG tablet Take 12.5 mg by mouth daily.  1   HYDROcodone-acetaminophen (NORCO/VICODIN) 5-325 MG tablet Take 1 tablet by mouth every 6 (six) hours as needed. for pain (Patient not taking: No sig reported)     primidone (MYSOLINE) 250 MG tablet TAKE 1 TABLET BY MOUTH ONCE DAILY AT BEDTIME ALONG  WITH  50  MG 90 tablet 1  primidone (MYSOLINE) 50 MG tablet TAKE 3 TABLETS BY MOUTH ONCE DAILY IN THE MORNING AND THEN TAKE 1 TAB  EVERY DAY AT BEDTIME WITH  250MG   TAB 360 tablet 1   topiramate (TOPAMAX) 25 MG tablet Take 1 tablet (25 mg total) by mouth at bedtime. 30 tablet 5   No current facility-administered medications on file prior to visit.    ALLERGIES: Allergies  Allergen Reactions   Nsaids Other (See Comments)    GI bleed    FAMILY HISTORY: No family history on file.    Objective:  Blood pressure 126/68, pulse 72, height 5\' 4"  (1.626 m), weight 163 lb (73.9 kg), SpO2 97 %. General: No acute distress.  Patient appears well-groomed.   Head:   Normocephalic/atraumatic Eyes:  Fundi examined but not visualized Neck: supple, no paraspinal tenderness, full range of motion Heart:  Regular rate and rhythm Lungs:  Clear to auscultation bilaterally Back: No paraspinal tenderness Neurological Exam: alert and oriented to person, place, and time.  Speech fluent and not dysarthric, language intact.  CN II-XII intact. Bulk and tone normal, muscle strength 5/5 throughout.  Fine postural and intention tremor in hands bilaterally.  Sensation to light touch intact.  Deep tendon reflexes 2+ throughout.  Finger to nose testing intact.  Gait normal, Romberg negative.   , DO  CC: , MD

## 2021-04-01 ENCOUNTER — Ambulatory Visit (INDEPENDENT_AMBULATORY_CARE_PROVIDER_SITE_OTHER): Payer: Medicare Other | Admitting: Neurology

## 2021-04-01 ENCOUNTER — Encounter: Payer: Self-pay | Admitting: Neurology

## 2021-04-01 ENCOUNTER — Other Ambulatory Visit: Payer: Self-pay

## 2021-04-01 VITALS — BP 126/68 | HR 72 | Ht 64.0 in | Wt 163.0 lb

## 2021-04-01 DIAGNOSIS — G250 Essential tremor: Secondary | ICD-10-CM | POA: Diagnosis not present

## 2021-04-01 MED ORDER — PRIMIDONE 250 MG PO TABS: ORAL_TABLET | ORAL | 1 refills | Status: DC

## 2021-04-01 MED ORDER — PRIMIDONE 50 MG PO TABS: ORAL_TABLET | ORAL | 1 refills | Status: DC

## 2021-04-01 NOTE — Patient Instructions (Signed)
Increase primidone to 200mg  in morning and 250mg  at bedtime Continue topiramate 25mg  at bedtime

## 2021-06-04 ENCOUNTER — Ambulatory Visit (INDEPENDENT_AMBULATORY_CARE_PROVIDER_SITE_OTHER): Payer: Medicare Other | Admitting: Physician Assistant

## 2021-06-04 ENCOUNTER — Other Ambulatory Visit: Payer: Self-pay

## 2021-06-04 ENCOUNTER — Encounter: Payer: Self-pay | Admitting: Physician Assistant

## 2021-06-04 DIAGNOSIS — Z85828 Personal history of other malignant neoplasm of skin: Secondary | ICD-10-CM | POA: Diagnosis not present

## 2021-06-04 DIAGNOSIS — Z1283 Encounter for screening for malignant neoplasm of skin: Secondary | ICD-10-CM

## 2021-06-04 DIAGNOSIS — L570 Actinic keratosis: Secondary | ICD-10-CM

## 2021-06-04 NOTE — Progress Notes (Signed)
   Follow-Up Visit   Subjective  Lindsay Cruz is a 70 y.o. female who presents for the following: Skin Problem (Patient here today for crusty lesion on her right lower lip x 6 weeks no bleeding, no pain.  Per patient she also has two lesions on both cheeks that are crusty as well x several months no bleeding, no pain. Personal history of non mole skin cancer. No personal history of atypical moles or melanoma. No family history of atypical moles, melanoma or non mole skin cancer. ).   The following portions of the chart were reviewed this encounter and updated as appropriate:  Tobacco  Allergies  Meds  Problems  Med Hx  Surg Hx  Fam Hx      Objective  Well appearing patient in no apparent distress; mood and affect are within normal limits.  A full examination was performed including scalp, head, eyes, ears, nose, lips, neck, chest, axillae, abdomen, back, buttocks, bilateral upper extremities, bilateral lower extremities, hands, feet, fingers, toes, fingernails, and toenails. All findings within normal limits unless otherwise noted below.  Left Buccal Cheek, Left Malar Cheek, Right Buccal Cheek, Right Lower Cutaneous Lip, Right Lower Vermilion Lip Erythematous patches with gritty scale.         Assessment & Plan  AK (actinic keratosis) (5) Right Lower Cutaneous Lip; Right Lower Vermilion Lip; Left Malar Cheek; Left Buccal Cheek; Right Buccal Cheek  Destruction of lesion - Left Buccal Cheek, Left Malar Cheek, Right Buccal Cheek, Right Lower Cutaneous Lip, Right Lower Vermilion Lip Complexity: simple   Destruction method: cryotherapy   Informed consent: discussed and consent obtained   Timeout:  patient name, date of birth, surgical site, and procedure verified Lesion destroyed using liquid nitrogen: Yes   Cryotherapy cycles:  3 Outcome: patient tolerated procedure well with no complications   Post-procedure details: wound care instructions given     I,  Labresha Mellor, PA-C, have reviewed all documentation's for this visit.  The documentation on 06/04/21 for the exam, diagnosis, procedures and orders are all accurate and complete.

## 2021-08-13 ENCOUNTER — Ambulatory Visit: Payer: Medicare Other | Admitting: Physician Assistant

## 2021-09-01 ENCOUNTER — Other Ambulatory Visit: Payer: Self-pay | Admitting: Neurology

## 2021-10-09 ENCOUNTER — Ambulatory Visit: Payer: Medicare Other | Admitting: Neurology

## 2021-10-14 ENCOUNTER — Other Ambulatory Visit: Payer: Self-pay | Admitting: Neurology

## 2021-12-22 NOTE — Progress Notes (Signed)
? ?NEUROLOGY FOLLOW UP OFFICE NOTE ? ?[place] ?[id number] ? ?Assessment/Plan:  ? ?Essential tremor ?Memory complaints ?  ? We will taper down on her medication.  Discontinue topiramate and taper down primidone to 100mg  twice daily.  She will keep me updated about how she is doing. ?Check B12 and repeat neuropsychological evaluation ?Follow up after testing. ?  ?Subjective:  ?Lindsay Cruz is a 71 year-old right-handed woman with hypertension and depression who follows up for essential tremor and memory.  Accompanied by her son who supplements history. ?  ?UPDATE: ?Current medication:  Topiramate 25mg  at bedtime; primidone 150mg  in AM and 300mg  QHS (higher doses caused side effects), Prozac ? ?She has good days and bad days.  They are worse when she feels more anxious.  She can't write as well but otherwise ADLs.  Not sure that she wants to remain on these medications.  Wants to decrease dose to see how more severe the tremors become. ? ?There continues to be concern about her memory.  She frequently has word-finding difficulty, repeats questions or forgets names or why she walked into a room.  Her children now need to accompany her to provider visits as they cannot trust she will tell them everything that is needed to know.  No disruption to ADLs. ?  ?HISTORY: ?She has had tremor in both hands for less than a year.  She notices her hand shakes when she is holding on object, such as handing a plate to somebody.  She also notices it sometimes when she writes.  She doesn't notice it when she is eating with utensils.  It is worse when she is made aware of it.  It improves after she drinks a beer.  She denies difficulty with movement or initiating walking after she stands.  Her brother has tremor, but was told it was drug-induced. ?  ?She has reported short-term memory problems in that she may repeat questions or not remember content of conversations.  Recall is more of a problem.  She has trouble recalling  names but not friends or family.  She has no difficulty paying her bills, remembering to take her medication or driving.  She denies family history of dementia.  She did have some college.  MRI of brain on 01/07/2017 showed chronic small vessel ischemic changes.  B12 level from 02/23/17 was 343.  She underwent neuropsychological testing on 04/26/17, which revealed no cognitive disorder. ?  ?Her brother has tremor.   ?  ?Past medications:  Propranolol ER 80mg  daily (side effects); gabapentin 300mg  twice daily ? ?PAST MEDICAL HISTORY: ?Past Medical History:  ?Diagnosis Date  ? Arthritis   ? Depression   ? Hypertension   ? ? ?MEDICATIONS: ?Current Outpatient Medications on File Prior to Visit  ?Medication Sig Dispense Refill  ? acetaminophen (TYLENOL) 500 MG tablet Take 1,000 mg by mouth every 6 (six) hours as needed for mild pain, moderate pain, fever or headache.    ? calcium carbonate (OS-CAL) 600 MG tablet Take 600 mg by mouth daily.    ? Cholecalciferol (VITAMIN D) 2000 units tablet Take 2,000 Units by mouth daily.    ? denosumab (PROLIA) 60 MG/ML SOSY injection Inject 60 mg into the skin every 6 (six) months.    ? esomeprazole (NEXIUM) 40 MG capsule Take 40 mg by mouth daily.    ? FLUoxetine (PROZAC) 40 MG capsule Take 40 mg by mouth daily.    ? hydrochlorothiazide (HYDRODIURIL) 12.5 MG tablet Take 12.5 mg by mouth  daily.  1  ? primidone (MYSOLINE) 250 MG tablet TAKE 1 TABLET BY MOUTH ONCE DAILY AT BEDTIME ALONG WITH 50 MG 90 tablet 0  ? primidone (MYSOLINE) 50 MG tablet TAKE 4 TABLETS BY MOUTH ONCE DAILY IN THE MORNING AND THEN TAKE 1 TAB EVERY DAY AT BEDTIME WITH 250MG  TAB 360 tablet 0  ? topiramate (TOPAMAX) 25 MG tablet Take 1 tablet (25 mg total) by mouth at bedtime. 30 tablet 5  ? ?No current facility-administered medications on file prior to visit.  ? ? ?ALLERGIES: ?Allergies  ?Allergen Reactions  ? Nsaids Other (See Comments)  ?  GI bleed  ? ? ?FAMILY HISTORY: ?No family history on file. ? ?  ?Objective:   ?Blood pressure 119/68, pulse 86, height 5\' 4"  (1.626 m), weight 165 lb (74.8 kg), SpO2 95 %. ?General: No acute distress.  Patient appears well-groomed.   ?Head:  Normocephalic/atraumatic ?Eyes:  Fundi examined but not visualized ?Neck: supple, no paraspinal tenderness, full range of motion ?Heart:  Regular rate and rhythm ?Lungs:  Clear to auscultation bilaterally ?Back: No paraspinal tenderness ?Neurological Exam: alert and oriented to person, place, and time.  Speech fluent and not dysarthric, language intact.   ?[employer] Mental Exam 12/23/2021  ?Weekday Correct 1  ?Current year 1  ?What state are we in? 1  ?Amount spent 1  ?Amount left 0  ?# of Animals 3  ?5 objects recall 1  ?Number series 2  ?Hour markers 2  ?Time correct 0  ?Placed X in triangle correctly 1  ?Largest Figure 1  ?Name of female 2  ?Date back to work 2  ?Type of work 2  ?State she lived in 0  ?Total score 20  ? ?CN II-XII intact. Bulk and tone normal, muscle strength 5/5 Slight postural and kinetic tremor in hands.  Light touch intact.  Gait steady.  Romberg negative. ? ? ?12/25/2021, DO ? ?CC: 03-18-1998, MD ? ? ? ? ? ? ?

## 2021-12-23 ENCOUNTER — Encounter: Payer: Self-pay | Admitting: Neurology

## 2021-12-23 ENCOUNTER — Other Ambulatory Visit (INDEPENDENT_AMBULATORY_CARE_PROVIDER_SITE_OTHER): Payer: Medicare Other

## 2021-12-23 ENCOUNTER — Ambulatory Visit (INDEPENDENT_AMBULATORY_CARE_PROVIDER_SITE_OTHER): Payer: Medicare Other | Admitting: Neurology

## 2021-12-23 ENCOUNTER — Other Ambulatory Visit: Payer: Self-pay

## 2021-12-23 VITALS — BP 119/68 | HR 86 | Ht 64.0 in | Wt 165.0 lb

## 2021-12-23 DIAGNOSIS — R413 Other amnesia: Secondary | ICD-10-CM

## 2021-12-23 DIAGNOSIS — G250 Essential tremor: Secondary | ICD-10-CM

## 2021-12-23 MED ORDER — PRIMIDONE 50 MG PO TABS: 100.0000 mg | ORAL_TABLET | Freq: Two times a day (BID) | ORAL | 5 refills | Status: DC

## 2021-12-23 NOTE — Patient Instructions (Addendum)
Stop topiramate ?Decrease primidone to 200mg  twice daily for one week, then 150mg  twice daily, then 100mg  twice daily ?Check B12 and neurocognitive exam ?Follow up in 6 months ?

## 2021-12-24 LAB — VITAMIN B12: Vitamin B-12: 802 pg/mL (ref 211–911)

## 2022-01-10 ENCOUNTER — Other Ambulatory Visit: Payer: Self-pay | Admitting: Neurology

## 2022-03-31 ENCOUNTER — Other Ambulatory Visit: Payer: Self-pay | Admitting: Neurology

## 2022-04-01 NOTE — Telephone Encounter (Signed)
Per Last ov 12/23/21;  We will taper down on her medication.  Discontinue topiramate and taper down primidone to 100mg  twice daily.  She will keep me updated about how she is doing.

## 2022-05-06 ENCOUNTER — Other Ambulatory Visit: Payer: Self-pay | Admitting: Neurology

## 2022-05-19 ENCOUNTER — Encounter: Payer: Self-pay | Admitting: Psychology

## 2022-06-11 ENCOUNTER — Other Ambulatory Visit: Payer: Self-pay | Admitting: Neurology

## 2022-06-26 ENCOUNTER — Ambulatory Visit: Payer: Medicare Other | Admitting: Neurology

## 2022-07-12 ENCOUNTER — Other Ambulatory Visit: Payer: Self-pay | Admitting: Neurology

## 2022-08-18 ENCOUNTER — Encounter: Payer: Medicare Other | Admitting: Psychology

## 2022-09-03 ENCOUNTER — Encounter: Payer: Medicare Other | Admitting: Psychology

## 2022-09-23 ENCOUNTER — Other Ambulatory Visit: Payer: Self-pay | Admitting: Neurology

## 2022-10-30 ENCOUNTER — Other Ambulatory Visit: Payer: Self-pay | Admitting: Neurology

## 2022-11-10 NOTE — Progress Notes (Unsigned)
NEUROLOGY FOLLOW UP OFFICE NOTE  [place] [id number]  Assessment/Plan:   Essential tremor Memory complaints    We will taper down on her medication.  Discontinue topiramate and taper down primidone to 100mg  twice daily.  She will keep me updated about how she is doing. Check B12 and repeat neuropsychological evaluation Follow up after testing.   Subjective:  Lindsay Cruz is a 72 year-old right-handed woman with hypertension and depression who follows up for essential tremor and memory.  Accompanied by her son who supplements history.   UPDATE: Current medication:  primidone 100mg  BID, Prozac  Last year, we tapered down on primidone and discontinued topiramate.  ***  She has concern about her memory.  She frequently has word-finding difficulty, repeats questions or forgets names or why she walked into a room.  Her children now need to accompany her to provider visits as they cannot trust she will tell them everything that is needed to know.  No disruption to ADLs.  B12 level in March 2023 was 802.  Neuropsychological evaluation was ordered but ***   HISTORY: She has had tremor in both hands for less than a year.  She notices her hand shakes when she is holding on object, such as handing a plate to somebody.  She also notices it sometimes when she writes.  She doesn't notice it when she is eating with utensils.  It is worse when she is made aware of it.  It improves after she drinks a beer.  She denies difficulty with movement or initiating walking after she stands.  Her brother has tremor, but was told it was drug-induced.   She has reported short-term memory problems in that she may repeat questions or not remember content of conversations.  Recall is more of a problem.  She has trouble recalling names but not friends or family.  She has no difficulty paying her bills, remembering to take her medication or driving.  She denies family history of dementia.  She did have some  college.  MRI of brain on 01/07/2017 showed chronic small vessel ischemic changes.  B12 level from 02/23/17 was 343.  She underwent neuropsychological testing on 04/26/17, which revealed no cognitive disorder.   Her brother has tremor.     Past medications:  Propranolol ER 80mg  daily (side effects); gabapentin 300mg  twice daily, topiramate  PAST MEDICAL HISTORY: Past Medical History:  Diagnosis Date   Arthritis    Depression    Hypertension     MEDICATIONS: Current Outpatient Medications on File Prior to Visit  Medication Sig Dispense Refill   acetaminophen (TYLENOL) 500 MG tablet Take 1,000 mg by mouth every 6 (six) hours as needed for mild pain, moderate pain, fever or headache.     calcium carbonate (OS-CAL) 600 MG tablet Take 600 mg by mouth daily.     Cholecalciferol (VITAMIN D) 2000 units tablet Take 2,000 Units by mouth daily.     denosumab (PROLIA) 60 MG/ML SOSY injection Inject 60 mg into the skin every 6 (six) months.     esomeprazole (NEXIUM) 40 MG capsule Take 40 mg by mouth daily.     FLUoxetine (PROZAC) 40 MG capsule Take 40 mg by mouth daily.     hydrochlorothiazide (HYDRODIURIL) 12.5 MG tablet Take 12.5 mg by mouth daily.  1   primidone (MYSOLINE) 50 MG tablet TAKE 2 TABLETS BY MOUTH TWICE A DAY 360 tablet 1   No current facility-administered medications on file prior to visit.    ALLERGIES:  Allergies  Allergen Reactions   Nsaids Other (See Comments)    GI bleed    FAMILY HISTORY: No family history on file.    Objective:  Blood pressure 119/68, pulse 86, height 5\' 4"  (1.626 m), weight 165 lb (74.8 kg), SpO2 95 %. General: No acute distress.  Patient appears well-groomed.   Head:  Normocephalic/atraumatic Eyes:  Fundi examined but not visualized Neck: supple, no paraspinal tenderness, full range of motion Heart:  Regular rate and rhythm Lungs:  Clear to auscultation bilaterally Back: No paraspinal tenderness Neurological Exam: alert and oriented to person,  place, and time.  Speech fluent and not dysarthric, language intact.  CN II-XII intact. Bulk and tone normal, muscle strength 5/5 throughout.  Postural and kinetic tremor in both hands.  Sensation to light touch intact.  Deep tendon reflexes 2+ throughout.  Finger to nose testing intact.  Gait normal, Romberg negative.   Metta Clines, DO  CC: Lucianne Lei, MD

## 2022-11-11 ENCOUNTER — Encounter: Payer: Self-pay | Admitting: Neurology

## 2022-11-11 ENCOUNTER — Ambulatory Visit (INDEPENDENT_AMBULATORY_CARE_PROVIDER_SITE_OTHER): Payer: Medicare Other | Admitting: Neurology

## 2022-11-11 VITALS — BP 127/73 | HR 80 | Ht 63.0 in | Wt 167.4 lb

## 2022-11-11 DIAGNOSIS — G250 Essential tremor: Secondary | ICD-10-CM | POA: Diagnosis not present

## 2022-11-11 MED ORDER — PRIMIDONE 50 MG PO TABS: 100.0000 mg | ORAL_TABLET | Freq: Two times a day (BID) | ORAL | 3 refills | Status: DC

## 2022-11-11 NOTE — Patient Instructions (Signed)
Primidone 100mg  twice daily

## 2023-01-04 ENCOUNTER — Ambulatory Visit: Payer: Medicare Other | Admitting: Neurology

## 2023-02-08 ENCOUNTER — Encounter: Payer: Medicare Other | Admitting: Psychology

## 2023-02-16 ENCOUNTER — Encounter: Payer: Medicare Other | Admitting: Psychology

## 2023-11-12 ENCOUNTER — Ambulatory Visit: Payer: Medicare Other | Admitting: Neurology

## 2023-11-23 ENCOUNTER — Ambulatory Visit: Payer: Medicare Other | Admitting: Neurology

## 2023-11-25 ENCOUNTER — Ambulatory Visit: Payer: Medicare Other | Admitting: Neurology

## 2023-12-07 ENCOUNTER — Other Ambulatory Visit: Payer: Self-pay | Admitting: Neurology

## 2023-12-15 NOTE — Progress Notes (Unsigned)
 NEUROLOGY FOLLOW UP OFFICE NOTE  [place] [id number]  Assessment/Plan:   Essential tremor    Will add propranolol at a smaller dose of 10mg  twice daily.  Perhaps propranolol will have a synergistic effect along with primidone and be tolerable.  If no improvement in 6-8 weeks, contact us.  We can increase to 20mg  twice daily.  If she has side effects, we can discontinue propranolol and instead increase primidone to 125mg  twice daily. Primidone 100mg  BID Follow up 6 months.   Subjective:   Lindsay Cruz is a 73 year-old right-handed woman with hypertension and depression who follows up for essential tremor and memory.  Accompanied by her son who supplements history.   UPDATE: Current medication:  primidone 100mg  BID  Notices some mild increased tremor with activities.      HISTORY: She has had tremor in both hands for less than a year.  She notices her hand shakes when she is holding on object, such as handing a plate to somebody.  She also notices it sometimes when she writes.  She doesn't notice it when she is eating with utensils.  It is worse when she is made aware of it.  It improves after she drinks a beer.  She denies difficulty with movement or initiating walking after she stands.  Her brother has tremor, but was told it was drug-induced.   She has reported short-term memory problems in that she may repeat questions or not remember content of conversations.  Recall is more of a problem.  She has trouble recalling names but not friends or family.  She has no difficulty paying her bills, remembering to take her medication or driving.  She denies family history of dementia.  She did have some college.  MRI of brain on 01/07/2017 showed chronic small vessel ischemic changes.  B12 level from 02/23/17 was 343.  She underwent neuropsychological testing on 04/26/17, which revealed no cognitive disorder.   Her brother has tremor.     Past medications:  Propranolol ER 80mg   daily (side effects); gabapentin 300mg  twice daily, topiramate  PAST MEDICAL HISTORY: Past Medical History:  Diagnosis Date   Arthritis    Depression    Hypertension     MEDICATIONS: Current Outpatient Medications on File Prior to Visit  Medication Sig Dispense Refill   acetaminophen (TYLENOL) 500 MG tablet Take 1,000 mg by mouth every 6 (six) hours as needed for mild pain, moderate pain, fever or headache.     calcium carbonate (OS-CAL) 600 MG tablet Take 600 mg by mouth daily.     Cholecalciferol (VITAMIN D) 2000 units tablet Take 2,000 Units by mouth daily.     denosumab (PROLIA) 60 MG/ML SOSY injection Inject 60 mg into the skin every 6 (six) months.     esomeprazole (NEXIUM) 40 MG capsule Take 40 mg by mouth daily.     FLUoxetine (PROZAC) 40 MG capsule Take 40 mg by mouth daily.     hydrochlorothiazide (HYDRODIURIL) 12.5 MG tablet Take 12.5 mg by mouth daily.  1   primidone (MYSOLINE) 50 MG tablet TAKE 2 TABLETS BY MOUTH 2 TIMES DAILY. 120 tablet 0   No current facility-administered medications on file prior to visit.    ALLERGIES: Allergies  Allergen Reactions   Nsaids Other (See Comments)    GI bleed    FAMILY HISTORY: No family history on file.    Objective:  Blood pressure 117/66, pulse 91, height 5\' 3"  (1.6 m), weight 175 lb (79.4 kg), SpO2  95%. General: No acute distress.  Patient appears well-groomed.   Head:  Normocephalic/atraumatic Neck:  Supple.  No paraspinal tenderness.  Full range of motion. Heart:  Regular rate and rhythm. Neuro:  Alert and oriented.  Speech fluent and not dysarthric.  Language intact.  CN II-XII intact.  Bulk and tone normal.  Muscle strength 5/5 throughout.  Postural and kinetic tremor in both hands.  Sensation to light touch intact.  Deep tendon reflexes 2+ throughout.  Finger to nose testing intact.  Gait normal.  Romberg negative.   Lindsay Millet, DO  CC: Lindsay Hutching, MD

## 2023-12-17 ENCOUNTER — Encounter: Payer: Self-pay | Admitting: Neurology

## 2023-12-17 ENCOUNTER — Ambulatory Visit: Payer: Medicare Other | Admitting: Neurology

## 2023-12-17 ENCOUNTER — Ambulatory Visit (INDEPENDENT_AMBULATORY_CARE_PROVIDER_SITE_OTHER): Payer: Medicare Other | Admitting: Neurology

## 2023-12-17 VITALS — BP 117/66 | HR 91 | Ht 63.0 in | Wt 175.0 lb

## 2023-12-17 DIAGNOSIS — G250 Essential tremor: Secondary | ICD-10-CM | POA: Diagnosis not present

## 2023-12-17 MED ORDER — PROPRANOLOL HCL 10 MG PO TABS
10.0000 mg | ORAL_TABLET | Freq: Two times a day (BID) | ORAL | 5 refills | Status: DC
Start: 2023-12-17 — End: 2024-06-14

## 2023-12-17 MED ORDER — PRIMIDONE 50 MG PO TABS: 100.0000 mg | ORAL_TABLET | Freq: Two times a day (BID) | ORAL | 5 refills | Status: DC

## 2023-12-17 NOTE — Patient Instructions (Signed)
 Continue primidone 100mg  twice daily Start propranolol 10mg  twice daily.  Contact me in 6-8 weeks with update.  We can increase dose if needed/tolerated.  Contact me sooner if you still cannot tolerate

## 2024-04-05 ENCOUNTER — Ambulatory Visit: Payer: Medicare Other | Admitting: Neurology

## 2024-06-14 ENCOUNTER — Other Ambulatory Visit: Payer: Self-pay | Admitting: Neurology

## 2024-06-20 NOTE — Progress Notes (Unsigned)
 NEUROLOGY FOLLOW UP OFFICE NOTE  Lindsay  Cruz [id number]  Assessment/Plan:   Essential tremor    Will add propranolol  at a smaller dose of 10mg  twice daily.  Perhaps propranolol  will have a synergistic effect along with primidone  and be tolerable.  If no improvement in 6-8 weeks, contact us .  We can increase to 20mg  twice daily.  If she has side effects, we can discontinue propranolol  and instead increase primidone  to 125mg  twice daily. *** Primidone  100mg  BID Follow up 6 months.   Subjective:   Lindsay  Cruz is a 73 year-old right-handed woman with hypertension and depression who follows up for essential tremor and memory.  Accompanied by her son who supplements history.   UPDATE: Current medication:  primidone  100mg  BID, propranolol  10mg  twice daily  In March, added small dose of propranolol  to primidone . ***      HISTORY: She has had tremor in both hands for less than a year.  She notices her hand shakes when she is holding on object, such as handing a plate to somebody.  She also notices it sometimes when she writes.  She doesn't notice it when she is eating with utensils.  It is worse when she is made aware of it.  It improves after she drinks a beer.  She denies difficulty with movement or initiating walking after she stands.  Her brother has tremor, but was told it was drug-induced.   She has reported short-term memory problems in that she may repeat questions or not remember content of conversations.  Recall is more of a problem.  She has trouble recalling names but not friends or family.  She has no difficulty paying her bills, remembering to take her medication or driving.  She denies family history of dementia.  She did have some college.  MRI of brain on 01/07/2017 showed chronic small vessel ischemic changes.  B12 level from 02/23/17 was 343.  She underwent neuropsychological testing on 04/26/17, which revealed no cognitive disorder.   Her brother has tremor.      Past medications:  Propranolol  ER 80mg  daily (side effects); gabapentin 300mg  twice daily, topiramate   PAST MEDICAL HISTORY: Past Medical History:  Diagnosis Date   Arthritis    Depression    Hypertension     MEDICATIONS: Current Outpatient Medications on File Prior to Visit  Medication Sig Dispense Refill   acetaminophen  (TYLENOL ) 500 MG tablet Take 1,000 mg by mouth every 6 (six) hours as needed for mild pain, moderate pain, fever or headache.     calcium carbonate (OS-CAL) 600 MG tablet Take 600 mg by mouth daily.     Cholecalciferol (VITAMIN D) 2000 units tablet Take 2,000 Units by mouth daily.     denosumab (PROLIA) 60 MG/ML SOSY injection Inject 60 mg into the skin every 6 (six) months.     esomeprazole (NEXIUM) 40 MG capsule Take 40 mg by mouth daily.     FLUoxetine  (PROZAC ) 40 MG capsule Take 40 mg by mouth daily.     hydrochlorothiazide  (HYDRODIURIL ) 12.5 MG tablet Take 12.5 mg by mouth daily.  1   primidone  (MYSOLINE ) 50 MG tablet Take 2 tablets (100 mg total) by mouth 2 (two) times daily. 120 tablet 5   propranolol  (INDERAL ) 10 MG tablet TAKE 1 TABLET BY MOUTH TWICE A DAY 60 tablet 0   No current facility-administered medications on file prior to visit.    ALLERGIES: Allergies  Allergen Reactions   Nsaids Other (See Comments)    GI bleed  FAMILY HISTORY: No family history on file.    Objective:  *** General: No acute distress.  Patient appears well-groomed.   ***   Juliene Dunnings, DO  CC: Norleen Guillaume, MD

## 2024-06-21 ENCOUNTER — Encounter: Payer: Self-pay | Admitting: Neurology

## 2024-06-21 ENCOUNTER — Ambulatory Visit (INDEPENDENT_AMBULATORY_CARE_PROVIDER_SITE_OTHER): Admitting: Neurology

## 2024-06-21 VITALS — BP 119/68 | HR 65 | Ht 64.0 in | Wt 173.0 lb

## 2024-06-21 DIAGNOSIS — G250 Essential tremor: Secondary | ICD-10-CM

## 2024-06-21 MED ORDER — PRIMIDONE 50 MG PO TABS: 100.0000 mg | ORAL_TABLET | Freq: Two times a day (BID) | ORAL | 5 refills | Status: AC

## 2024-06-21 NOTE — Patient Instructions (Signed)
 Continue primidone  100mg  twice daily Discontinue propranolol 

## 2024-06-29 ENCOUNTER — Encounter: Payer: Self-pay | Admitting: Neurology

## 2024-07-11 ENCOUNTER — Other Ambulatory Visit: Payer: Self-pay | Admitting: Neurology

## 2025-01-05 ENCOUNTER — Ambulatory Visit: Admitting: Neurology

## 2025-01-23 ENCOUNTER — Ambulatory Visit: Admitting: Neurology
